# Patient Record
Sex: Male | Born: 1940 | Race: White | Hispanic: No | Marital: Married | Smoking: Former smoker
Health system: Southern US, Community
[De-identification: ages and names within clinical notes are randomized; demographics above are authoritative.]

## PROBLEM LIST (undated history)

## (undated) ENCOUNTER — Encounter

## (undated) DIAGNOSIS — I480 Paroxysmal atrial fibrillation: Secondary | ICD-10-CM

## (undated) DIAGNOSIS — I2510 Atherosclerotic heart disease of native coronary artery without angina pectoris: Secondary | ICD-10-CM

## (undated) DIAGNOSIS — D369 Benign neoplasm, unspecified site: Secondary | ICD-10-CM

## (undated) DIAGNOSIS — R55 Syncope and collapse: Secondary | ICD-10-CM

## (undated) DIAGNOSIS — C801 Malignant (primary) neoplasm, unspecified: Secondary | ICD-10-CM

## (undated) DIAGNOSIS — I499 Cardiac arrhythmia, unspecified: Secondary | ICD-10-CM

## (undated) DIAGNOSIS — E041 Nontoxic single thyroid nodule: Secondary | ICD-10-CM

## (undated) DIAGNOSIS — Z860101 Personal history of adenomatous and serrated colon polyps: Secondary | ICD-10-CM

## (undated) DIAGNOSIS — S066X9A Traumatic subarachnoid hemorrhage with loss of consciousness of unspecified duration, initial encounter: Secondary | ICD-10-CM

## (undated) DIAGNOSIS — Z7901 Long term (current) use of anticoagulants: Secondary | ICD-10-CM

## (undated) DIAGNOSIS — M21622 Bunionette of left foot: Secondary | ICD-10-CM

## (undated) DIAGNOSIS — I10 Essential (primary) hypertension: Secondary | ICD-10-CM

## (undated) DIAGNOSIS — E785 Hyperlipidemia, unspecified: Secondary | ICD-10-CM

## (undated) DIAGNOSIS — E0590 Thyrotoxicosis, unspecified without thyrotoxic crisis or storm: Secondary | ICD-10-CM

## (undated) DIAGNOSIS — K5790 Diverticulosis of intestine, part unspecified, without perforation or abscess without bleeding: Secondary | ICD-10-CM

## (undated) DIAGNOSIS — I429 Cardiomyopathy, unspecified: Secondary | ICD-10-CM

## (undated) HISTORY — PX: CORONARY ANGIOPLASTY: SHX604

## (undated) HISTORY — PX: JOINT REPLACEMENT: SHX530

## (undated) HISTORY — PX: ADENOIDECTOMY: SUR15

## (undated) HISTORY — PX: OTHER SURGICAL HISTORY: SHX169

## (undated) HISTORY — PX: TONSILLECTOMY: SUR1361

## (undated) HISTORY — PX: FOOT SURGERY: SHX648

---

## 1994-01-04 DIAGNOSIS — E0520 Thyrotoxicosis with toxic multinodular goiter without thyrotoxic crisis or storm: Secondary | ICD-10-CM

## 1994-01-04 HISTORY — DX: Thyrotoxicosis with toxic multinodular goiter without thyrotoxic crisis or storm: E05.20

## 1997-10-11 HISTORY — PX: COLONOSCOPY: SHX174

## 1998-06-05 HISTORY — PX: CORONARY ANGIOPLASTY WITH STENT PLACEMENT: SHX49

## 1999-06-05 HISTORY — PX: CORONARY ANGIOPLASTY WITH STENT PLACEMENT: SHX49

## 2002-12-12 HISTORY — PX: COLONOSCOPY: SHX174

## 2008-04-02 ENCOUNTER — Ambulatory Visit: Payer: Self-pay | Admitting: Gastroenterology

## 2008-04-02 HISTORY — PX: COLONOSCOPY: SHX174

## 2008-08-12 ENCOUNTER — Ambulatory Visit: Payer: Self-pay | Admitting: Internal Medicine

## 2008-08-14 ENCOUNTER — Ambulatory Visit: Payer: Self-pay | Admitting: Internal Medicine

## 2008-10-22 ENCOUNTER — Ambulatory Visit: Payer: Self-pay | Admitting: Internal Medicine

## 2009-01-04 HISTORY — PX: TOTAL HIP ARTHROPLASTY: SHX124

## 2013-07-20 ENCOUNTER — Ambulatory Visit: Payer: Self-pay | Admitting: Gastroenterology

## 2013-07-20 HISTORY — PX: COLONOSCOPY: SHX174

## 2013-07-24 LAB — PATHOLOGY REPORT

## 2014-10-29 ENCOUNTER — Other Ambulatory Visit: Payer: Self-pay | Admitting: Family Medicine

## 2014-10-29 DIAGNOSIS — M25551 Pain in right hip: Principal | ICD-10-CM

## 2014-10-29 DIAGNOSIS — G8929 Other chronic pain: Secondary | ICD-10-CM

## 2014-11-08 ENCOUNTER — Ambulatory Visit: Payer: Self-pay

## 2014-11-08 ENCOUNTER — Other Ambulatory Visit: Payer: Self-pay

## 2014-11-22 ENCOUNTER — Ambulatory Visit
Admission: RE | Admit: 2014-11-22 | Discharge: 2014-11-22 | Disposition: A | Discharge status: Home / Self Care | Payer: Medicare Other | Source: Ambulatory Visit | Attending: Family Medicine | Admitting: Family Medicine

## 2014-11-22 DIAGNOSIS — G8929 Other chronic pain: Secondary | ICD-10-CM

## 2014-11-22 DIAGNOSIS — K5730 Diverticulosis of large intestine without perforation or abscess without bleeding: Secondary | ICD-10-CM | POA: Insufficient documentation

## 2014-11-22 DIAGNOSIS — M1611 Unilateral primary osteoarthritis, right hip: Secondary | ICD-10-CM | POA: Insufficient documentation

## 2014-11-22 DIAGNOSIS — M71551 Other bursitis, not elsewhere classified, right hip: Secondary | ICD-10-CM | POA: Insufficient documentation

## 2014-11-22 DIAGNOSIS — M25551 Pain in right hip: Principal | ICD-10-CM

## 2014-11-22 DIAGNOSIS — M948X5 Other specified disorders of cartilage, thigh: Secondary | ICD-10-CM | POA: Diagnosis not present

## 2014-11-22 MED ORDER — IOHEXOL 180 MG/ML  SOLN: 12.0000 mL | Freq: Once | INTRAMUSCULAR | Status: DC | PRN

## 2015-01-05 HISTORY — PX: TOTAL HIP ARTHROPLASTY: SHX124

## 2015-09-23 ENCOUNTER — Other Ambulatory Visit: Payer: Self-pay | Admitting: Internal Medicine

## 2015-09-23 DIAGNOSIS — E0590 Thyrotoxicosis, unspecified without thyrotoxic crisis or storm: Secondary | ICD-10-CM

## 2015-09-26 ENCOUNTER — Other Ambulatory Visit: Payer: Self-pay | Admitting: Internal Medicine

## 2015-09-26 DIAGNOSIS — E0590 Thyrotoxicosis, unspecified without thyrotoxic crisis or storm: Secondary | ICD-10-CM

## 2015-10-14 ENCOUNTER — Ambulatory Visit
Admission: RE | Admit: 2015-10-14 | Discharge: 2015-10-14 | Disposition: A | Discharge status: Home / Self Care | Payer: Medicare Other | Source: Ambulatory Visit | Attending: Internal Medicine | Admitting: Internal Medicine

## 2015-10-14 ENCOUNTER — Encounter
Admission: RE | Admit: 2015-10-14 | Discharge: 2015-10-14 | Disposition: A | Discharge status: Home / Self Care | Payer: Medicare Other | Source: Ambulatory Visit | Attending: Internal Medicine | Admitting: Internal Medicine

## 2015-10-14 DIAGNOSIS — E042 Nontoxic multinodular goiter: Secondary | ICD-10-CM | POA: Insufficient documentation

## 2015-10-14 DIAGNOSIS — E0590 Thyrotoxicosis, unspecified without thyrotoxic crisis or storm: Secondary | ICD-10-CM

## 2015-10-14 MED ORDER — SODIUM IODIDE I-123 7.4 MBQ CAPS
149.0400 | ORAL_CAPSULE | Freq: Once | ORAL | Status: AC
  Administered 2015-10-14: 149.04 via ORAL

## 2015-10-15 ENCOUNTER — Encounter
Admission: RE | Admit: 2015-10-15 | Discharge: 2015-10-15 | Disposition: A | Discharge status: Home / Self Care | Payer: Medicare Other | Source: Ambulatory Visit | Attending: Internal Medicine | Admitting: Internal Medicine

## 2015-10-15 DIAGNOSIS — E042 Nontoxic multinodular goiter: Secondary | ICD-10-CM | POA: Diagnosis not present

## 2015-10-15 DIAGNOSIS — E0590 Thyrotoxicosis, unspecified without thyrotoxic crisis or storm: Secondary | ICD-10-CM | POA: Diagnosis present

## 2015-10-17 ENCOUNTER — Ambulatory Visit
Admission: RE | Admit: 2015-10-17 | Discharge: 2015-10-17 | Disposition: A | Discharge status: Home / Self Care | Payer: Medicare Other | Source: Ambulatory Visit | Attending: Internal Medicine | Admitting: Internal Medicine

## 2015-10-17 DIAGNOSIS — E0590 Thyrotoxicosis, unspecified without thyrotoxic crisis or storm: Secondary | ICD-10-CM | POA: Diagnosis not present

## 2015-10-17 MED ORDER — SODIUM IODIDE I 131 CAPSULE
40.1700 | Freq: Once | INTRAVENOUS | Status: AC | PRN
Start: 2015-10-17 — End: 2015-10-17
  Administered 2015-10-17: 40.17 via ORAL

## 2016-01-05 DIAGNOSIS — S066X9A Traumatic subarachnoid hemorrhage with loss of consciousness of unspecified duration, initial encounter: Secondary | ICD-10-CM

## 2016-01-05 DIAGNOSIS — S066XAA Traumatic subarachnoid hemorrhage with loss of consciousness status unknown, initial encounter: Secondary | ICD-10-CM

## 2016-01-05 HISTORY — DX: Traumatic subarachnoid hemorrhage with loss of consciousness status unknown, initial encounter: S06.6XAA

## 2016-01-05 HISTORY — DX: Traumatic subarachnoid hemorrhage with loss of consciousness of unspecified duration, initial encounter: S06.6X9A

## 2016-02-05 DIAGNOSIS — I639 Cerebral infarction, unspecified: Secondary | ICD-10-CM

## 2016-02-05 HISTORY — DX: Cerebral infarction, unspecified: I63.9

## 2016-02-18 DIAGNOSIS — F17200 Nicotine dependence, unspecified, uncomplicated: Secondary | ICD-10-CM

## 2016-02-18 DIAGNOSIS — S065X9A Traumatic subdural hemorrhage with loss of consciousness of unspecified duration, initial encounter: Secondary | ICD-10-CM

## 2016-02-18 DIAGNOSIS — Z7901 Long term (current) use of anticoagulants: Secondary | ICD-10-CM

## 2016-02-18 DIAGNOSIS — Z8679 Personal history of other diseases of the circulatory system: Secondary | ICD-10-CM

## 2016-02-18 DIAGNOSIS — E069 Thyroiditis, unspecified: Secondary | ICD-10-CM

## 2016-02-18 DIAGNOSIS — G936 Cerebral edema: Secondary | ICD-10-CM

## 2016-02-18 DIAGNOSIS — I4891 Unspecified atrial fibrillation: Secondary | ICD-10-CM

## 2016-02-18 DIAGNOSIS — E785 Hyperlipidemia, unspecified: Secondary | ICD-10-CM

## 2016-02-18 DIAGNOSIS — Z96643 Presence of artificial hip joint, bilateral: Secondary | ICD-10-CM

## 2016-02-18 DIAGNOSIS — I482 Chronic atrial fibrillation: Secondary | ICD-10-CM

## 2016-02-18 DIAGNOSIS — R55 Syncope and collapse: Secondary | ICD-10-CM

## 2016-02-18 DIAGNOSIS — S066X9A Traumatic subarachnoid hemorrhage with loss of consciousness of unspecified duration, initial encounter: Principal | ICD-10-CM

## 2016-02-18 DIAGNOSIS — I2510 Atherosclerotic heart disease of native coronary artery without angina pectoris: Secondary | ICD-10-CM

## 2016-02-18 DIAGNOSIS — I609 Nontraumatic subarachnoid hemorrhage, unspecified: Secondary | ICD-10-CM

## 2016-02-18 DIAGNOSIS — I10 Essential (primary) hypertension: Secondary | ICD-10-CM

## 2016-02-18 DIAGNOSIS — I629 Nontraumatic intracranial hemorrhage, unspecified: Secondary | ICD-10-CM

## 2016-02-19 ENCOUNTER — Inpatient Hospital Stay: Admit: 2016-02-19 | Discharge: 2016-02-23 | Disposition: A | Discharge status: Home / Self Care | Admitting: Surgery

## 2016-02-19 DIAGNOSIS — S066X9A Traumatic subarachnoid hemorrhage with loss of consciousness of unspecified duration, initial encounter: Principal | ICD-10-CM

## 2016-02-19 DIAGNOSIS — E0590 Thyrotoxicosis, unspecified without thyrotoxic crisis or storm: Secondary | ICD-10-CM

## 2016-02-19 DIAGNOSIS — S065X9A Traumatic subdural hemorrhage with loss of consciousness of unspecified duration, initial encounter: Secondary | ICD-10-CM

## 2016-02-19 DIAGNOSIS — I2510 Atherosclerotic heart disease of native coronary artery without angina pectoris: Secondary | ICD-10-CM

## 2016-02-19 DIAGNOSIS — I4891 Unspecified atrial fibrillation: Principal | ICD-10-CM

## 2016-02-19 DIAGNOSIS — R55 Syncope and collapse: Secondary | ICD-10-CM

## 2016-02-19 DIAGNOSIS — E785 Hyperlipidemia, unspecified: Secondary | ICD-10-CM

## 2016-02-19 DIAGNOSIS — I10 Essential (primary) hypertension: Secondary | ICD-10-CM

## 2016-02-19 DIAGNOSIS — Z8679 Personal history of other diseases of the circulatory system: Secondary | ICD-10-CM

## 2016-02-19 DIAGNOSIS — I482 Chronic atrial fibrillation: Secondary | ICD-10-CM

## 2016-02-19 DIAGNOSIS — Z7901 Long term (current) use of anticoagulants: Secondary | ICD-10-CM

## 2016-02-19 DIAGNOSIS — G936 Cerebral edema: Secondary | ICD-10-CM

## 2016-02-19 DIAGNOSIS — E069 Thyroiditis, unspecified: Secondary | ICD-10-CM

## 2016-02-19 DIAGNOSIS — F17200 Nicotine dependence, unspecified, uncomplicated: Secondary | ICD-10-CM

## 2016-02-19 DIAGNOSIS — W19XXXA Unspecified fall, initial encounter: Secondary | ICD-10-CM

## 2016-02-19 DIAGNOSIS — Z96643 Presence of artificial hip joint, bilateral: Secondary | ICD-10-CM

## 2016-02-19 MED ORDER — METOPROLOL TARTRATE 50 MG PO TABS
50 mg | Freq: Two times a day (BID) | ORAL | Status: DC
Start: 2016-02-19 — End: 2016-02-19

## 2016-02-19 MED ORDER — PRAVASTATIN SODIUM 10 MG PO TABS
10 mg | Freq: Every evening | ORAL | Status: DC
Start: 2016-02-19 — End: 2016-02-19

## 2016-02-19 MED ORDER — SIMVASTATIN 20 MG PO TABS
20 mg | ORAL_TABLET | Freq: Every evening | ORAL
Start: 2016-02-19 — End: ?

## 2016-02-19 MED ORDER — DEXTROSE 50 % IV SOLN
30 mL | INTRAVENOUS | Status: DC | PRN
Start: 2016-02-19 — End: 2016-02-24

## 2016-02-19 MED ORDER — APIXABAN 5 MG PO TABS
5 mg | Freq: Two times a day (BID) | ORAL | Status: SS
Start: 2016-02-19 — End: 2016-02-23

## 2016-02-19 MED ORDER — PERFLUTREN LIPID MICROSPHERE 6.52 MG/ML IV SUSP
.2-1.3 mL | INTRAVENOUS | Status: CP | PRN
Start: 2016-02-19 — End: ?

## 2016-02-19 MED ORDER — LEVETIRACETAM 500 MG PO TABS
500 mg | Freq: Two times a day (BID) | ORAL | Status: DC
Start: 2016-02-19 — End: 2016-02-24

## 2016-02-19 MED ORDER — HYDROMORPHONE HCL 2 MG/ML IJ SOLN JX
.5 mg | Freq: Once | INTRAVENOUS | Status: CP
Start: 2016-02-19 — End: ?

## 2016-02-19 MED ORDER — PRAVASTATIN SODIUM 10 MG PO TABS
10 mg | Freq: Every evening | ORAL | Status: DC
Start: 2016-02-19 — End: 2016-02-24

## 2016-02-19 MED ORDER — PNEUMOCOCCAL 13-VAL CONJ VACC IM SUSP
.5 mL | Freq: Once | INTRAMUSCULAR | Status: CP
Start: 2016-02-19 — End: ?

## 2016-02-19 MED ORDER — GLUCOSE 4 G PO CHEW JX
16 g | ORAL | Status: DC | PRN
Start: 2016-02-19 — End: 2016-02-19

## 2016-02-19 MED ORDER — LEVETIRACETAM 500 MG PO TABS
1000 mg | Freq: Once | ORAL | Status: CP
Start: 2016-02-19 — End: ?

## 2016-02-19 MED ORDER — OXYCODONE HCL 5 MG PO TABS
5 mg | ORAL | Status: DC | PRN
Start: 2016-02-19 — End: 2016-02-19

## 2016-02-19 MED ORDER — ACETAMINOPHEN 500 MG PO TABS
1000 mg | Freq: Four times a day (QID) | ORAL | Status: CP
Start: 2016-02-19 — End: ?

## 2016-02-19 MED ORDER — MUPIROCIN 2 % EX OINT
Freq: Two times a day (BID) | NASAL | Status: DC
Start: 2016-02-19 — End: 2016-02-24

## 2016-02-19 MED ORDER — METOPROLOL TARTRATE 50 MG PO TABS
50 mg | Freq: Two times a day (BID) | ORAL | Status: DC
Start: 2016-02-19 — End: 2016-02-24

## 2016-02-19 MED ORDER — ASPIRIN 81 MG PO TABS
81 mg | Freq: Every day | ORAL | Status: SS
Start: 2016-02-19 — End: 2016-02-23

## 2016-02-19 NOTE — ED Provider Notes
Basophils % 0.1  0 - 1 %   APTT - Normal    PTT 32  24 - 35 seconds   MAGNESIUM - Normal    Magnesium 1.9  1.8 - 2.6 mg/dL   PHOSPHORUS - Normal    Phosphorus,Inorganic 3.1  2.5 - 4.5 mg/dL   THROMBOELASTOGRAPH (TEG)   PLATELET MAPPING TEG (PMAP)   TYPE & SCREEN    ABO Grouping AB      Rh Type Positive      Antibody Screen Negative      Specimen Expiration 2016-02-21 23:59     CBC AND DIFFERENTIAL         Imaging (Read by ED Provider):  not applicable          ED Course & Re-Evaluation     ED Course     Patient brought as a transfer from OSH for fall on thinners.   Patient alert and oriented with non-focal exam. Well appearig  Noted to have undetectable TSH at OSH. Diffsuely tender thyroid here and TTP. Concern for thyroiditis without clinical s/s of thyroid storm. Offered medicine consult but surgery declined at this time.  Per history of patient his fall was mechnical. He did have a fairly extensive medical eval at OSH which was negative. No massive PE or other obvious cause of syncope.  Patient good from a medical standpoint at this time and suitable for admit to SICU with serial neuro exams and repeat head imaging.  Cannot reverse 2/2 eliquis.      MDM   Decide to obtain history from someone other than the patient: Yes - EMS    Decide to obtain previous medical records: No    Clinical Lab Test(s): N/A    Diagnostic Tests (Radiology, EKG): N/A    Independent Visualization (ED US, Wet Prep, Other): No    Discussed patient with NON-ED Provider: Admitting Team      ED Disposition   ED Disposition: Admit      ED Clinical Impression   ED Clinical Impression:   Atrial fibrillation  SAH (subarachnoid hemorrhage)  Thyroiditis  Intracranial hemorrhage      ED Patient Status   Patient Status:   Guarded        ED Medical Evaluation Initiated   Medical Evaluation Initiated:   Yes, filed at 02/19/16 0008  by Bonna GainsSlama, Richard, MD             Bonna GainsSlama, Richard, MD  Resident  02/19/16 [phone]

## 2016-02-19 NOTE — Progress Notes
Pt stated that his wife, Rondel Jumbollen Bommarito whose number is [phone] will be coming in the morning.  He requested a catholic priest, so called Father Dallas BreedingBlair Gaines and requested he call us back.  Left patient's name and room number.

## 2016-02-19 NOTE — Consults
Inpatient Speech Language Pathology  Cognitive Evaluation  Session: # 1      History of Present Illness: Alexander Padilla is a 76 y.o. male admitted on 02/18/2016 with PMH of CAD s/p stent placement x2 who fell due to syncopal episode (vasovagal vs hypovolemic vs cardiac). The height of the fall was estimated to be from standing. There is a history of amnesia. he did have LOC. The patient was reported to have a GCS of 15 in the field. He did not require intubation prior to arrival. EMS reports that the patient was hemodynamically stable in the field. Upon arrival the patient was complaining of thyroid pain.     Room/Bed: 8033/8033-02    Past Medical History:   Diagnosis Date   ? Atrial fibrillation    ? CAD (coronary artery disease)     s/p pci   ? Fall    ? HTN (hypertension)    ? Hyperthyroidism     s/p RAI     Past Surgical History:   Procedure Laterality Date   ? TOTAL HIP ARTHROPLASTY Bilateral        Current Condition/Chief Complaint: Alexander Padilla is a 76 y.o. male whose who was referred for cognitive evaluation.    Vitals: Vitals stable    Precautions:  ? FALL    Current Diet Order: Diet (cardiac fat,chol,sod,caff rstrctd)    Pain:  ? Pain Pre-Treatment: No, 0/10  ? Pain Post-Treatment:  No, 0/10    Living Environment/Function:  ? Lives: with spouse  Home assistance: 24/7  ? Prior Level of Function: was independent with communication to convey daily personal, medical, and safety wants/needs in the home and the community    Subjective:  Pt and RN agreed to intervention    **Was an interpreter used: NA    Tracheostomy: None    Response to Stimuli:   ? WNL    Visual Tracking:  ? WNL    Auditory Comprehension:  ? 1-step commands: WFL  ? 2-3 step commands: WFL  ? Constituant questions: WFL  ? YES/NO questions: WFL    Orientation:  ? Name  ? Place  ? Age  ? DOB  ? Month  ? Day  ? Year  ? Situation    Memory:  Short Term Memory:  ? Immediate recall: 5/5  ? Delayed recall: 2/5

## 2016-02-19 NOTE — Consults
?   Sensation: Intact  ? Proprioception: Intact   ? Coordination: Within Functional Limits  ? Motor Control: Within Functional Limits  ? Tone: WFL  ? Reflexes: Not Tested    Balance:   ? Sitting: Static  Supervision (S) and Dynamic  Supervision (S)  ? Standing: Static  Contact guard assist (CGA) and Dynamic  Minimum assistance (min A)    Functional Activities:  Bed Mobility:  ? Rolling Right: Supervision (S) with head of bed (HOB) elevated  ? Supine to Sit: Minimal Assist (Min A) with head of bed (HOB) elevated  Transfers:  ? Sit to/from Stand: Contact Guard Assist (CGA) with No assistive device  Locomotion:  ? GAIT: Patient ambulated to bathroom with No assistive device, gait belt, Minimal Assist (Min A). Gait Quality: ambulated with unsteady gait, wide BOS, and tended to reach out for furniture for support.  Continued gait training with RW, see treatment below.    Outcome Measures:  [employer] AM-PAC Basic Mobility Inpatient Short Form   (Without Stair Climbing):  HOW MUCH DIFFICULTY DOES THE PATIENT CURRENTLY HAVE? SCORE   1. Turning over in bed (including adjusting bedclothes, sheets and blankets)?  2. Moving from lying on back to sitting on the side of the bed?  3. Sitting down on and standing up from a chair with arms? 4  3  3    HOW MUCH HELP FROM ANOTHER PERSON DOES THE PATIENT CURRENTLY NEED?    4. Moving to and from a bed to a chair (including a wheelchair)?  5. Need help to walk in hospital room? 3  3   Scoring: 1 = total (dependent)/unable; 2 = a lot (max/mod assist); 3 = a little (min assist/CGA/SBA/SUP); 4 = none (independent or modified independent).    AM-PAC BASIC MOBILITY SCALE SHORT FORM (without stairs): Raw Score: 16. AM-PAC Score: 45.54. CMS Score: 40.64% - CK      Treatment on Evaluation:    ? TREATMENT TIME: 10 min  ? GAIT: Patient ambulated 100 ft x2 with Rolling Walker, gait belt, Contact Guard Assist (CGA). Gait Quality: Progressed from min A with no AD

## 2016-02-19 NOTE — Plan of Care
Problem: Discharge Planning  Goal: Safe effective discharge    Intervention: Assess discharge needs  Discharge plan is likely home with Wife to assist pending medical and Rehab clearance.

## 2016-02-19 NOTE — ED Notes
Pt log rolled maintaining C-spine immobilization.  No tenderness noted to C-, T-, or L-spine area.  No step offs noted.  Pt log rolled back to supine position.  Warm blankets applied.

## 2016-02-19 NOTE — Consults
Occupational Therapy Evaluation      Start Time (min): 1024  End Time (min): 1046  Total Time (min): 22  Room/Bed: 8033/8033-02    Occupational Profile: Alexander Padilla is a 76 y.o. male admitted on 02/18/2016 s/p fall from standing 2t syncopal episode with R frontal SAH      ICD-10-CM ICD-9-CM   1. SAH (subarachnoid hemorrhage) I60.9 430   2. Atrial fibrillation I48.91 427.31   3. Thyroiditis E06.9 245.9   4. Intracranial hemorrhage I62.9 432.9     Past Medical History:   Diagnosis Date   ? Atrial fibrillation    ? CAD (coronary artery disease)     s/p pci   ? Fall    ? HTN (hypertension)    ? Hyperthyroidism     s/p RAI     Past Surgical History:   Procedure Laterality Date   ? TOTAL HIP ARTHROPLASTY Bilateral        Precautions:  ? FALL    Extremity Precautions:  ? None indicated    Orthotic, Protective, & Supportive Devices:  ? None    Living Environment/Function:  ? Lives: with spouse  Home assistance: 24/7  ? Lives in: condo  ? Stairs to Enter: 0  ? Home DME: rolling walker and shower chair  ? Bathroom Setup: standard toilet, walk-in shower, shower chair  ? Employment: Patient is retired.  ? Prior Level of Function: was independent with self-care, transfers, ambulation, household tasks and/or recreational activities    Subjective: Pt/RN consents to therapy, c.o 4/10 headache. RN informed and pt repositioned at the end of the session      Examination:  Observation: Pt supine in bed, ICU monitors, 1L O2 via NC    Vitals: Vitals stable    Cognition:   ? Overall Cognitive Functional Status: Patient at baseline level of cognition  ? Arousal/Alertness: appropriate responses to stimuli  ? Orientation: oriented x4  ? Command Following: follows one-step simple commands  ? Safety Awareness/Judgement: good awareness of safety precautions  ? Problem Solving: able to solve independently  ? Sequencing: able to sequence independently  ? Attention Span: appears intact  ? Memory: appears intact

## 2016-02-19 NOTE — ED Provider Notes
?   Drug use: Not on file   ? Sexual activity: Not on file     Other Topics Concern   ? Not on file     Social History Narrative       Review of Systems   HENT: Negative for neck pain and neck stiffness.    Respiratory: Negative for chest tightness and shortness of breath.    Cardiovascular: Negative for chest pain.   Gastrointestinal: Negative for vomiting.   Skin: Negative for pallor.   All other systems reviewed and are negative.      Physical Exam     ED Triage Vitals   BP 02/18/16 2355 118/68   Pulse 02/18/16 2355 104   Resp 02/18/16 2355 26   Temp 02/18/16 2355 37.6 ?C (99.6 ?F)   Temp src 02/18/16 2355 Oral   Height --    Weight --    SpO2 02/18/16 2355 94 %   BMI (Calculated) --              Physical Exam   Constitutional: He is oriented to person, place, and time. He appears well-developed and well-nourished. No distress.   HENT:   Head: Normocephalic and atraumatic.   Right Ear: External ear normal.   Left Ear: External ear normal.   Eyes: EOM are normal. Pupils are equal, round, and reactive to light.   Neck: Normal range of motion. Neck supple. No JVD present. No tracheal deviation present. Thyromegaly (bilateral. TTP) present.   Cardiovascular: Normal rate and regular rhythm.  Exam reveals no friction rub.    No murmur heard.  Pulmonary/Chest: Effort normal and breath sounds normal. No respiratory distress.   Abdominal: Soft. He exhibits no distension. There is no tenderness.   Musculoskeletal: Normal range of motion. He exhibits no edema, tenderness or deformity.   Neurological: He is alert and oriented to person, place, and time.   Skin: Skin is warm and dry. No rash noted.   Psychiatric: He has a normal mood and affect.       Differential DDx: Considered but not limited to the following ICH, C spine injury, laceration, fracture, mechanical fall or syncope-related fall, abdominal viscera injury, PTX, hemothorax, long bone fracture, cardiac injury, arterial injury, venous injury

## 2016-02-19 NOTE — ED Provider Notes
History     Chief Complaint   Patient presents with   ? FALL FROM STANDING       HPI Comments: 76 y/o male transfer from OSH s/p mechanical fall on coumadin with IC bleed and SAH. Patient currently complaning of head and anterior soft tissue neck pain.    Patient is a 76 y.o. male presenting with Trauma.   Trauma  Mechanism of injury: fall  Injury location: head/neck  Injury location detail: head  Arrived directly from scene: no     Fall:       Fall occurred: tripped       Impact surface: unknown       Point of impact: head       Entrapped after fall: no       Suspicion of alcohol use: no       Suspicion of drug use: no    EMS/PTA data:       Blood loss: none       Responsiveness: alert       Oriented to: person, place, situation and time       Airway interventions: none       Breathing interventions: none       IV access: none       IO access: none       Medications administered: none       Immobilization: none       Airway condition since incident: stable       Breathing condition since incident: stable       Circulation condition since incident: stable       Mental status condition since incident: stable       Disability condition since incident: stable    Current symptoms:       Pain scale: 5/10       Pain quality: aching       Pain timing: constant       Associated symptoms:             Denies chest pain, neck pain and vomiting.     Relevant PMH:       Pharmacological risk factors:             Anticoagulation therapy.        Tetanus status: UTD      No Known Allergies    Patient's Medications    No medications on file       No past medical history on file.    No past surgical history on file.    No family history on file.    Social History     Social History   ? Marital status: Married     Spouse name: N/A   ? Number of children: N/A   ? Years of education: N/A     Social History Main Topics   ? Smoking status: Not on file   ? Smokeless tobacco: Not on file   ? Alcohol use Not on file

## 2016-02-19 NOTE — ED Provider Notes
Differential DDx: Considered but not limited to the following ICH, C spine injury, laceration, fracture, mechanical fall or syncope-related fall, abdominal viscera injury, PTX, hemothorax, long bone fracture, cardiac injury, arterial injury, venous injury    Is this an Emergent Medical Condition? Yes - Threat to Patient or Fetus  409.901 FS  641.19 FS  627.732 (16) FS    ED Workup   Procedures    Labs:  -   PROTIME-INR - Abnormal        Result Value Ref Range    Protime 15.9 (*) 11.9 - 14.3 seconds    INR 1.3 (*) 0.9 - 1.1   BASIC METABOLIC PANEL - Abnormal     Sodium 137  135 - 145 mmol/L    Potassium 4.2  3.3 - 4.6 mmol/L    Chloride 100 (*) 101 - 110 mmol/L    CO2 24  21 - 29 mmol/L    Urea Nitrogen 18  6 - 22 mg/dL    Creatinine 0.82  0.67 - 1.17 mg/dL    BUN/Creatinine Ratio 22.0  6.0 - 22.0 (calc)    Glucose 135 (*) 71 - 99 mg/dL    Calcium 8.4 (*) 8.6 - 10.0 mg/dL    Osmolality Calc 277.7      Anion Gap 13  4 - 16 mmol/L    EGFR >59  mL/min/1.73M2    Comment:   Reference range: =>90 ml/min/1.73M2  eGFR estimates are unable to accurately differentiate levels of GFR above 60 ml/min/1.73M2.   CBC AUTODIFF - Abnormal     WBC 11.66 (*) 4.5 - 11 x10E3/uL    RBC 4.18 (*) 4.50 - 6.30 x10E6/uL    Hemoglobin 12.4 (*) 14.0 - 18.0 g/dL    Hematocrit 36.6 (*) 40.0 - 54.0 %    MCV 87.6  82.0 - 101.0 fl    MCH 29.7  27.0 - 34.0 pg    MCHC 33.9  31.0 - 36.0 g/dL    RDW 12.6  12.0 - 16.1 %    Platelet Count 143  140 - 440 thou/cu mm    MPV 10.3  9.5 - 11.5 fl    nRBC % 0.0  0.0 - 1.0 %    Absolute NRBC Count 0.00      Neutrophils % 89.7 (*) 34.0 - 73.0 %    Lymphocytes % 4.6 (*) 25.0 - 45.0 %    Monocytes % 5.3  2.0 - 6.0 %    Eosinophils % 0.0 (*) 1.0 - 4.0 %    Immature Granulocytes % 0.3  0.0 - 2.0 %    Neutrophils Absolute 10.46 (*) 1.80 - 8.70 x10E3/uL    Lymphocytes Absolute 0.54  x10E3/uL    Monocytes Absolute 0.62  x10E3/uL    Eosinophils Absolute 0.00  x10E3/uL    Basophil Absolute 0.01  x10E3/uL

## 2016-02-19 NOTE — Progress Notes
Pt working with PT/OT. Pt voided with therapist but in the toilet so unable to account for amount.

## 2016-02-19 NOTE — Plan of Care
Problem: Pain, Acute & Chronic  Goal: Pain is relieved/acceptable level with minimal side effects  Outcome: Ongoing  See note at bottom,    Problem: Impaired Skin Integrity, Risk for  Goal: Maintains integrity of mucous membranes, skin and tissues  Outcome: Ongoing  See bottom note    Problem: Risk for Injury R/T Falls  Goal: Prevent Falls  Outcome: Ongoing      Comments: Pt. Arrived to unit at 0300, no family at bedside, Pt is A&O X 4 , small laceration on left side of cheek, Bed in lowest position, side rails up 3/4, room free of clutter.  This Nurse assessed and orientated pt to unit, policies, and procedures. Educated patient on Safety regimens, and  scheduled and prn meds, purposes, and side effects. Pt verbalized understanding. This Nurse encouraged pt to expressed question and concerns, pt denies any additional concerns at this time. Will continue to monitor and follow POC

## 2016-02-19 NOTE — Progress Notes
02/19/16 0457   Vitals   Pulse 105   Heart Rate Source Monitor   Heart Rate 94   Resp 18   BP (!) 126/94   BP Method Automatic   Patient Position Supine   BP Location Left upper arm   NIBP MAP  99 mmHg      Pt transported to CT and back via bed. VSS. NAD noted.

## 2016-02-19 NOTE — Progress Notes
Per screening assessment data:  Meets pneumococcal requirements (age > 6765) -> ordered x 1 for 02/19/2016 at 0900  Hazle Cocaonald Floresca, RN, Pharm.D., BCPS

## 2016-02-19 NOTE — ED Notes
Jacqlyn KraussSylvester, MD with SICU team at bedside with pt.

## 2016-02-19 NOTE — ED Provider Notes
History     Chief Complaint   Patient presents with   ? FALL FROM STANDING       HPI Comments: 76 y/o male transfer from OSH s/p mechanical fall on coumadin with IC bleed and SAH. Patient currently complaning of head and anterior soft tissue neck pain.    Patient is a 76 y.o. male presenting with Trauma.   Trauma  Mechanism of injury: fall  Injury location: head/neck  Injury location detail: head  Arrived directly from scene: no     Fall:       Fall occurred: tripped       Impact surface: unknown       Point of impact: head       Entrapped after fall: no       Suspicion of alcohol use: no       Suspicion of drug use: no    EMS/PTA data:       Blood loss: none       Responsiveness: alert       Oriented to: person, place, situation and time       Airway interventions: none       Breathing interventions: none       IV access: none       IO access: none       Medications administered: none       Immobilization: none       Airway condition since incident: stable       Breathing condition since incident: stable       Circulation condition since incident: stable       Mental status condition since incident: stable       Disability condition since incident: stable    Current symptoms:       Pain scale: 5/10       Pain quality: aching       Pain timing: constant       Associated symptoms:             Denies chest pain, neck pain and vomiting.     Relevant PMH:       Pharmacological risk factors:             Anticoagulation therapy.        Tetanus status: UTD      No Known Allergies    Patient's Medications    No medications on file       Past Medical History:   Diagnosis Date   ? Atrial fibrillation    ? CAD (coronary artery disease)     s/p pci   ? HTN (hypertension)    ? Hyperthyroidism     s/p RAI       Past Surgical History:   Procedure Laterality Date   ? TOTAL HIP ARTHROPLASTY Bilateral        Family History   Problem Relation Age of Onset   ? No Known Problems Mother    ? No Known Problems Father

## 2016-02-19 NOTE — ED Notes
Pt taken to room with belongings and chart on monitors.

## 2016-02-19 NOTE — Consults
to CGA with use of RW, had 1 episode of Bil knee buckling but able to recover with CGA.       Post Treatment:   Patient Position/Safety: Sitting in recliner, Call Bell within reach, Tray table within reach, All lines and leads intact, Handoff to nurse on patient position    Assessment: Patient requires little assistance for mobility; however, is unsteady upon standing and had multiple episodes of LOB during ambulation.  Pt is at increased risk for falls and not safe to d/c home at this time.  Anticipate with further progress pt may be able to d/c home with supervision.    Problem list:  ? impaired bed mobility  ? impaired transfers  ? impaired gait  ? balance deficits  ? increased risk for falls  ? decreased activity tolerance    Other Recommended Services: none    Discharge Recommendations:   ? Discharge Disposition: Inpatient Rehab at this time due to high fall risk and need for assistance with mobility, will reassess d/c needs next visit.      ? DME Recommendations:  1. Already has necessary DMEs    **Note: Discharge recommendations may change based on patient progress. Please refer to the most updated progress note for current discharge recommendations.     Plan of Care: Patient will be seen 3-5 times per week for gait training, therapeutic activities, therapeutic exercise, balance, activity tolerance, patient/caregiver education and training and safety.    G-Codes:  G-Code: Mobility    Current Status: CK 40-59% Impaired     Goal Status: CI 1-19% Impaired     Discharge Status: N/A    _________________________   Katharina CaperNura Haney, PT   02/19/2016

## 2016-02-19 NOTE — ED Notes
Pt resting in bed awaiting further orders.  NAD noted.  No change in pt assessment at this time.

## 2016-02-19 NOTE — Consults
Long Term Memory: Appears intact (+) recall of current President    Attention:  ? Sustained    Pragmatics:  ? Flat affect    Behavior:  ? Alert  ? Appropriate    Executive Functions:  ? Sequencing: simple WFL; complex WFL  ? Reasoning: WFL  ? Insight: WFL - appropriate insight to curren situation and deficits   ? Abstraction: WFL  ? Serial 7s: [facility]    Auditory Processing:  ? Follows 1 step commands WFL  ? Answers wh-questions WFL  ? Y/N questions WFL    Problem Solving:  ? Simple problems: WFL  ? Complex problems: WFL  ? Safety: Appropriate safety awareness - will defer environmental safety to PT/OT    Safety:  ? Intermittent supervision    Outcome Measures:   MOCA-blind: 19/22 - WFL >=18/22    Assessment:   Pt presents with functional cognition - suspect at baseline at this time.  Observed trace memory deficits, compensatory strategies on board.  Recommend intermittent supervision upon d/c planning for completion of high level tasks.  No further therapeutic intervention indicated at this time - SLP to d/c orders - Please reconsult if status changes - Thanks for the referrals.      Discharge Disposition: Home with intermittent caregiver supervision    G-Code: Attention: Current Status: CH 0% Impaired     Goal Status: CH 0% Impaired     Discharge Status: CH 0% Impaired    Recommendations:  1. Functional cognition - suspect at baseline  2. Intermittent supervision for completion of high level tasks  3. SLP to d/c orders - Thanks for the referrals    Plan of Care: Discharged from SLP Services    _________________________  Kylie B. Lowery, MS, CCC-SLP  02/19/2016       Start Time (min): 0906  End Time (min): 0916  Total Time (min): 10

## 2016-02-19 NOTE — ED Notes
[facility]  Pt was found at home on floor by wife. Pt is stated to have questionable syncopal episode. Pt is currently on aspirin and Eliquis.  Pt had positive LOC.  No wounds noted at this time.  Pt sitting up on stretcher upon arrival.  Pt A/O x 4 PERRLA 3.  Trace swelling noted to anterior neck area.  Breath sounds clear BIL with clear heart tones.  Abd soft non-tender with bowel sounds noted.  Pelvis stable to palpate.  Pt able to move all extremities with good distal peripheral pulses.

## 2016-02-19 NOTE — ED Provider Notes
Is this an Emergent Medical Condition? {SH ED EMERGENT MEDICAL CONDITION:[id number]}  409.901 FS  641.19 FS  627.732 (16) FS    ED Workup   Procedures    Labs:  - - No data to display      Imaging (Read by ED Provider):  {Imaging [phone]}      EKG (Read by ED Provider):  {EKG [phone]}        ED Course & Re-Evaluation     ED Course     Patient brought as a transfer from OSH for fall on thinners.   Patient alert and oriented with non-focal exam. Well appearig  Noted to have undetectable TSH at OSH. Diffsuely tender thyroid here and TTP. Concern for thyroiditis without clinical s/s of thyroid storm.  Per history of patient his fall was mechnical. He did have a fairly extensive medical eval at OSH which was negative. No massive PE or other obvious cause of syncope.  Patients noted to hae    MDM   Decide to obtain history from someone other than the patient: {SH ED Lamonte SakaiJX MDM - OBTAIN ZOXWRUE:45409}HISTORY:28378}    Decide to obtain previous medical records: [facility]{SH ED Lamonte SakaiJX MDM - PREVIOUS MED REC - NO WJX:91478}YES:28380}    Clinical Lab Test(s): {SH ED Lamonte SakaiJX MDM ORDERED AND REVIEWED:28124}    Diagnostic Tests (Radiology, EKG): {SH ED Lamonte SakaiJX MDM ORDERED AND REVIEWED:28124}    Independent Visualization (ED US, Wet Prep, Other): {SH ED Lamonte SakaiJX MDM NO YES GNFAOZHY:86578}WILDCARD:26444}    Discussed patient with NON-ED Provider: {SH ED Lamonte SakaiJX MDM - ANOTHER PROVIDER:28381}      ED Disposition   ED Disposition: Admit      ED Clinical Impression   ED Clinical Impression:   Atrial fibrillation  SAH (subarachnoid hemorrhage)  Thyroiditis  Intracranial hemorrhage      ED Patient Status   Patient Status:   {[facility]:[id number]}        ED Medical Evaluation Initiated   Medical Evaluation Initiated:   Yes, filed at 02/19/16 0008  by Bonna GainsSlama, Richard, MD

## 2016-02-19 NOTE — Plan of Care
Problem: Risk for Injury R/T Falls  Goal: Prevent Falls  Outcome: Ongoing  Educated pt on unit safety procedures and expectations, including the importance of utilizing the call light. Pt verbalized understanding and has light within reach. Will continue to closely monitor.

## 2016-02-19 NOTE — Plan of Care
Problem: Impaired bed mobility (PT)  Goal: PT STG : Patient will transition supine to/from sit in bed from left/right:   Patient will transition supine to/from sit in bed from bilateral with Independence (I) and no cues to prepare for OOB mobility.   Outcome: Ongoing      Problem: Impaired gait (PT)  Goal: PT STG : Patient will ambulate over even surfaces:   Patient will ambulate 200 feet over even surfaces with Rolling walker (RW) as needed, Modified Independence (Mod I) and no cues to mobilize in the home and community.    Outcome: Ongoing      Problem: Impaired balance (PT)  Goal: PT LTG : Patient will maintain static sitting balance in midline by discharge :  Patient will increase his AM Pac Mobility Scale score from 16/20 to 19/20 to return home safely upon d/c.    Outcome: Ongoing    Goal: PT STG : Patient will maintain dynamic standing balance within/outside base of support :   STG - Patient will maintain dynamic standing balance outside base of support during functional tasks with Supervision (S), No assistive device and no cues to reduce risk for falls during mobility.    Outcome: Ongoing      Comments: Patient will be seen by Physical Therapy 3-5x per week for 2 week(s), for impaired bed mobility, impaired transfers, impaired gait, balance deficits and increased risk for falls.    _________________________  Katharina CaperNura Haney, PT  02/19/2016

## 2016-02-19 NOTE — ED Provider Notes
Is this an Emergent Medical Condition? {SH ED EMERGENT MEDICAL CONDITION:([phone]}  409.901 FS  641.19 FS  627.732 (16) FS    ED Workup   Procedures    Labs:  - - No data to display      Imaging (Read by ED Provider):  {Imaging [phone]}      EKG (Read by ED Provider):  {EKG [phone]}        ED Course & Re-Evaluation     ED Course         MDM   Decide to obtain history from someone other than the patient: {SH ED Lamonte SakaiJX MDM - OBTAIN ZOXWRUE:45409}HISTORY:28378}    Decide to obtain previous medical records: [facility]{SH ED Lamonte SakaiJX MDM - PREVIOUS MED REC - NO WJX:91478}YES:28380}    Clinical Lab Test(s): {SH ED Lamonte SakaiJX MDM ORDERED AND REVIEWED:28124}    Diagnostic Tests (Radiology, EKG): {SH ED Lamonte SakaiJX MDM ORDERED AND REVIEWED:28124}    Independent Visualization (ED US, Wet Prep, Other): {SH ED Lamonte SakaiJX MDM NO YES GNFAOZHY:86578}WILDCARD:26444}    Discussed patient with NON-ED Provider: {SH ED Lamonte SakaiJX MDM - ANOTHER PROVIDER:28381}      ED Disposition   ED Disposition: Admit      ED Clinical Impression   ED Clinical Impression:   Atrial fibrillation  SAH (subarachnoid hemorrhage)  Thyroiditis  Intracranial hemorrhage      ED Patient Status   Patient Status:   {SH ED Lamonte SakaiJX PATIENT STATUS:[phone]}        ED Medical Evaluation Initiated   Medical Evaluation Initiated:   Yes, filed at 02/19/16 0008  by Bonna GainsSlama, Richard, MD

## 2016-02-19 NOTE — ED Notes
Report called to Asher MuirJamie, Charity fundraiserN. Pt stable at report.  Pt to go to room 8033.

## 2016-02-19 NOTE — Consults
?   Insight: decreased awareness of deficits    Right Upper Extremity:  ? Within Functional Limits [facility](WFL) for AROM/PROM and strength    Left Upper Extremity:  ? Within Functional Limits [facility](WFL) for AROM/PROM and strength    Vision History:   ? No significant visual history  Vision:  ? Vision WNL for visual fields, saccades, EOMs and acuity    Neurological Examination of the Upper Extremity:  ? Sensation: Intact  ? Proprioception: Intact   ? Coordination: Within Functional Limits  ? Motor Control: Within Functional Limits  ? Tone: WFL    ADLs:  ? Feeding: Independence (I)   ? Grooming: Contact guard assist (CGA) in standing at sinkside  ? UB dressing: Set up assist  ? LB dressing: Contact guard assist (CGA)  ? Bathing: Contact guard assist (CGA)  ? Toileting: Contact guard assist (CGA)    Functional Activities:  Bed Mobility:  ? Supine to Sit: Minimal Assist (Min A) with head of bed (HOB) elevated  Transfers:  ? Sit to/from Stand: Contact Guard Assist (CGA) with No assistive device    Functional mobility: Min A w/o AD x within his room, CGA w/ RW for functional mobility in the hallway/around the nurses station    Balance:   ? Sitting: Static  Supervision (S), Dynamic  Supervision (S)  ? Standing: Research officer, trade uniontatic  Contact Guard Assist (CGA), Dynamic  Minimal Assist (Min A)    Outcome Measures:  ? AM-PAC "6-clicks" Short Form: Raw Score: 18. AM-PAC Score: 38.66. CMS Score: 40.47% - CK    Special Tests:  ? NA    Treatment on Evaluation:  Was additional treatment provided? Yes ADLs: Pt donned back hospital gown from EOB level with Setup. Pt completed grooming tasks in standing at sinkside with CGA for balance. Pt completed toileting using hospital toilet with CGA for standing balance.    ADL Transfers: Pt completed STS from EOB with CGA and functional mobility within his room w/o AD with min A due to mild balance deficits and 2 episodes of B knee buckling.  Total Coded Minutes: 15    Post Treatment:

## 2016-02-19 NOTE — Consults
Physical Therapy Evaluation      Start Time (min): 1025  End Time (min): 1046  Total Time (min): 21  Room/Bed: 8033/8033-02    History of Present Illness: Alexander Padilla is a 76 y.o. male admitted on 02/18/2016 s/p fall, found to have SAH and SDH.       ICD-10-CM ICD-9-CM   1. SAH (subarachnoid hemorrhage) I60.9 430   2. Atrial fibrillation I48.91 427.31   3. Thyroiditis E06.9 245.9   4. Intracranial hemorrhage I62.9 432.9     Past Medical History:   Diagnosis Date   ? Atrial fibrillation    ? CAD (coronary artery disease)     s/p pci   ? Fall    ? HTN (hypertension)    ? Hyperthyroidism     s/p RAI     Past Surgical History:   Procedure Laterality Date   ? TOTAL HIP ARTHROPLASTY Bilateral        Precautions:  ? FALL    Extremity Precautions:  ? None indicated    Orthotic, Protective, & Supportive Devices:  ? None    Living Environment/Function:  ? Lives: with spouse  Home assistance: 24/7  ? Lives in: condo  ? Stair/steps to enter: 0  ? Home DME: does not use equipment but owns RW and bedside commode  ? Prior Level of Function: was independent with self-care, transfers, ambulation, household tasks and/or recreational activities    Subjective: Pt agreed to PT, c/o pain 4/10 in head, no increase in pain at end of treatment, pt positioned in recliner for comfort at end of treatment.    **Was an interpreter used: NA    Examination:  Observation: Pt presented supine in bed, on 1L O2 via NC, VSS, alert    Vitals: Vitals stable    Cognition:   ? Alert and Oriented to Person, Place, Time and Situation  ? Command Following: Follows Multi-step commands    Right Upper Extremity:  ? Within Functional Limits [facility](WFL) for AROM/PROM and strength    Left Upper Extremity:  ? Within Functional Limits [facility](WFL) for AROM/PROM and strength    Right Lower Extremity:  ? Within Functional Limits [facility](WFL) for AROM/PROM and strength    Left Lower Extremity:  ? Within Functional Limits [facility](WFL) for AROM/PROM and strength    Neurological Examination:

## 2016-02-19 NOTE — Consults
Patient Position/safety: Sitting in recliner, Call Bell within reach, Tray table within reach, All lines and leads intact, Handoff to nurse on patient position    Assessment: Patient with mild balance deficits, 2 episodes of B knee buckling during functional mobility which is making pt a high risk for falls. Pt also requires Min-SUP with OOB ADLs due to balance deficits and weakness. Pt not safe for dc home at this time. Recommend IPR when medically cleared by MD    Patient with deficits in:  ? ADLs, IADLs, judgment, balance, bed mobility, transfers, functional mobility and activity tolerance    Discharge Recommendations:   ? Discharge Disposition: Inpatient Rehab    ? DME Recommendations:  1. To be determined    **Note: Discharge recommendations may change based on patient progress. Please refer to the most updated progress note for current discharge recommendations.     Plan of Care: Patient will be seen 3-5 times per week for ADLs, IADLs , therapeutic activities , therapeutic exercise , patient/caregiver education and neuromuscular reeducation.      ________________________   Malachi ParadiseSheena Kristine Estalilla, OT   02/19/2016

## 2016-02-19 NOTE — ED Provider Notes
Social History     Social History   ? Marital status: Married     Spouse name: N/A   ? Number of children: N/A   ? Years of education: N/A     Social History Main Topics   ? Smoking status: Current Some Day Smoker   ? Smokeless tobacco: Never Used   ? Alcohol use Yes   ? Drug use: No   ? Sexual activity: Not Asked     Other Topics Concern   ? None     Social History Narrative   ? None       Review of Systems   HENT: Negative for neck pain and neck stiffness.    Respiratory: Negative for chest tightness and shortness of breath.    Cardiovascular: Negative for chest pain.   Gastrointestinal: Negative for vomiting.   Skin: Negative for pallor.   All other systems reviewed and are negative.      Physical Exam       ED Triage Vitals   BP 02/18/16 2355 118/68   Pulse 02/18/16 2355 104   Resp 02/18/16 2355 26   Temp 02/18/16 2355 37.6 ?C (99.6 ?F)   Temp src 02/18/16 2355 Oral   Height --    Weight --    SpO2 02/18/16 2355 94 %   BMI (Calculated) --              Physical Exam   Constitutional: He is oriented to person, place, and time. He appears well-developed and well-nourished. No distress.   HENT:   Head: Normocephalic and atraumatic.   Right Ear: External ear normal.   Left Ear: External ear normal.   Eyes: EOM are normal. Pupils are equal, round, and reactive to light.   Neck: Normal range of motion. Neck supple. No JVD present. No tracheal deviation present. Thyromegaly (bilateral. TTP) present.   Cardiovascular: Normal rate and regular rhythm.  Exam reveals no friction rub.    No murmur heard.  Pulmonary/Chest: Effort normal and breath sounds normal. No respiratory distress.   Abdominal: Soft. He exhibits no distension. There is no tenderness.   Musculoskeletal: Normal range of motion. He exhibits no edema, tenderness or deformity.   Neurological: He is alert and oriented to person, place, and time.   Skin: Skin is warm and dry. No rash noted.   Psychiatric: He has a normal mood and affect.

## 2016-02-19 NOTE — Progress Notes
Pastoral care screening. Seen pt seating in couch watching TV. Chaplain initiated a relationship of care and support. Pt stated he's doing fine, request [facility] [place] visit for confession and blessed sacrament. Advice pt, [facility] [place] are volunteers and they will come at their own convenient time. Pt express understanding. I called [facility]or[facility] P[facility]riest spoke with PATRECIA (clerk) provided to her pt request, Elease Hashimotoatricia stated she will pass it on to available [facility]. Pastoral care, prayer provided as request. Assisted by [employer] intern Levon.

## 2016-02-19 NOTE — ED Notes
Pt resting in bed awaiting further orders. NAD noted.  No further change in pt assessment at this time.

## 2016-02-20 DIAGNOSIS — S066X9A Traumatic subarachnoid hemorrhage with loss of consciousness of unspecified duration, initial encounter: Principal | ICD-10-CM

## 2016-02-20 DIAGNOSIS — I482 Chronic atrial fibrillation: Secondary | ICD-10-CM

## 2016-02-20 DIAGNOSIS — F17200 Nicotine dependence, unspecified, uncomplicated: Secondary | ICD-10-CM

## 2016-02-20 DIAGNOSIS — G936 Cerebral edema: Secondary | ICD-10-CM

## 2016-02-20 DIAGNOSIS — Z7901 Long term (current) use of anticoagulants: Secondary | ICD-10-CM

## 2016-02-20 DIAGNOSIS — S065X9A Traumatic subdural hemorrhage with loss of consciousness of unspecified duration, initial encounter: Secondary | ICD-10-CM

## 2016-02-20 DIAGNOSIS — E069 Thyroiditis, unspecified: Secondary | ICD-10-CM

## 2016-02-20 DIAGNOSIS — E785 Hyperlipidemia, unspecified: Secondary | ICD-10-CM

## 2016-02-20 DIAGNOSIS — Z8679 Personal history of other diseases of the circulatory system: Secondary | ICD-10-CM

## 2016-02-20 DIAGNOSIS — I2510 Atherosclerotic heart disease of native coronary artery without angina pectoris: Secondary | ICD-10-CM

## 2016-02-20 DIAGNOSIS — I10 Essential (primary) hypertension: Secondary | ICD-10-CM

## 2016-02-20 DIAGNOSIS — Z96643 Presence of artificial hip joint, bilateral: Secondary | ICD-10-CM

## 2016-02-20 DIAGNOSIS — R55 Syncope and collapse: Secondary | ICD-10-CM

## 2016-02-20 MED ORDER — TRAMADOL HCL 50 MG PO TABS
50 mg | Freq: Three times a day (TID) | ORAL | Status: DC | PRN
Start: 2016-02-20 — End: 2016-02-24

## 2016-02-20 MED ORDER — OXYCODONE HCL 5 MG PO TABS
5 mg | Freq: Four times a day (QID) | ORAL | Status: DC | PRN
Start: 2016-02-20 — End: 2016-02-20

## 2016-02-20 NOTE — Progress Notes
02/20/16 1224   Vitals   Pulse 89   Heart Rate Source Monitor   Heart Rate 85   Resp 17   BP 132/79   BP Method Automatic   NIBP MAP  106 mmHg   Oxygen Therapy   SpO2 96 %   Patient vitals signs standing.

## 2016-02-20 NOTE — Progress Notes
not had follow up labs  Check TSH, Free T3, Free T4- follow up with outpatient endocrine as scheduled    Extremities:  No issues, intact    Disp: d/c home with supervision    Social: Full Code  Wife is Alesia BandaOK, Ellen, [phone]    Quality Checklist:  HOB up 30 degrees: Yes  Chlorhexidine mouth care: No  Adequate glucose control:Yes  Central Line: No  Can Foley be removed:no   GI prophylaxis: po diet  DVT/VTE prophylaxis : sequential compression device(s), mild increase in hemorrhages, will hold chemical DVT ppx for now  Pressure ulcer: No  Mobilize out of bed: Yes  Vent wean assessment completed: N/A    Iona HansenShannon F Terrell, ARNP  02/20/2016  8:13 AM        ATTENDING NOTE    I have seen and examined the patient with Iona HansenShannon F Terrell, ARNP. I have reviewed the note and agree with the assessment and plan.     On my exam, I find the following: sitting up in bed on phone and eating breakfast.  Remains fully alert and GCS 15.  CT head stable.  ASA & A-C on hold for 4 weeks.    Cathlean MarseillesJoseph R Shiber, MD  02/20/2016 1:03 PM

## 2016-02-20 NOTE — Progress Notes
Department of Surgery  Division of Acute Care Surgery  SURGICAL CRITICAL CARE ICU PROGRESS NOTE      Admit Date: 02/18/2016   LOS: 1 day     Post op Day: na  Admitting service: Alexander Padilla          Subjective:     I have reviewed the patient course and previous notes.  Reason for ICU Admission/ Perioperative Course: Syncope,TBI on Eliquis and ASA, SAH, extra-axial hemorrhage    Events of past 24: In the past 24 hours the patient has had no problems    INJURIES/PROBLEMS  Active Hospital Problems    Diagnosis Date Noted   ? *Subarachnoid hematoma 02/19/2016   ? Syncope 02/19/2016   ? On anticoagulant therapy 02/19/2016   ? Chronic atrial fibrillation 02/19/2016      Resolved Hospital Problems    Diagnosis Date Noted Date Resolved   No resolved problems to display.     Current Hospital Medications  Reviewed  Scheduled Medications  ? acetaminophen  1,000 mg Oral Q6H SCH   ? levETIRAcetam  500 mg Oral Q12H SCH   ? metoprolol tartrate  50 mg Oral BID   ? mupirocin   Nasal BID   ? pneumococcal conjugate vaccine, PCV13  0.5 mL Intramuscular Once   ? pravastatin  10 mg Oral Nightly     PRN Medications    dextrose 30 mL Intravenous PRN   oxyCODONE 5 mg Oral Q6H PRN   perflutren lipid microspheres 0.2-1.3 mL Intravenous PRN     Continuous infusions    Objective Data and Assessment:       Vital Signs: Last Filed Vitals Signs: 24 Hour Range   BP: 114/73 (02/16 0700)  Temp: 37 ?C (98.6 ?F) (02/15 2000)  Resp: 21 (02/16 0700)  SpO2: 94 % (02/16 0700)   Temp:  [36.9 ?C (98.5 ?F)-37.1 ?C (98.7 ?F)] 37 ?C (98.6 ?F)  Pulse:  [82-119] 105  Resp:  [12-22] 21  BP: (99-148)/(62-93) 114/73      CNS/Neuro:   Exam:  oriented to time, place and person    Pain Scale: 4/10 / 10    GCS:   4 - Opens eyes on own,   6 - Follows simple motor commands,   5 - Alert and oriented,   15   INTUBATED no  Sedation protocol: Tylenol ATC   HEENT:  Head: normocephalic, small abrasion left cheek  Eyes: pupils equal and reactive, extraocular eye movements intact

## 2016-02-20 NOTE — Consults
Gait: not assessed    Data Review:  CTH 2200 12/17/16: R frontal SAH with small underlying SDH. No signs of MLS and with patent basal cisterns.  CTH 0445 12/18/16: Redemonstration of R frontal SAH with underlying SDH, stable since last exam. No signs of MLS and with continued patency of the basal cisterns.      Assessment & Recommendations:   [age] M with CAD s/p stenting x2 on daily ASA/Eliquis who presents s/p fall 2/2 syncopal episode with R frontal SAH with repeat 6-hour scan stable. Patient is neurologically intact without focal deficit. The patient was seen and discussed with Dr. Lorenda Cahillahmathulla with the following plan:    -Q1 neurochecks  -HOB >30  -SCDs/TEDs  -Hold DVT chemoprophylaxis  -Na goals 140-145  -SBP <160  -Hold ASA/Eliquis, recommend d/c for 6 weeks post-bleed  -Obtain repeat CTH at 2200 tonight 02/19/16  -ICP management per primary service  -STAT CTH for acute neurologic decline  -Page NSG for acute neurologic decline  -Medical Management per primary team  -Neurosurgery to follow    [facility] StanfordChristopher Menster PA-C  [facility] Department of Neurosurgery  Pager [phone]      Riki AltesChristopher Menster  02/19/2016 7:18 AM

## 2016-02-20 NOTE — Progress Notes
not had follow up labs  Check TSH, Free T3, Free T4- follow up with outpatient endocrine as scheduled    Extremities:  No issues, intact    Disp: d/c home with supervision    Social: Full Code  Wife is Alesia BandaOK, Ellen, [phone]    Quality Checklist:  HOB up 30 degrees: Yes  Chlorhexidine mouth care: No  Adequate glucose control:Yes  Central Line: No  Can Foley be removed:no   GI prophylaxis: po diet  DVT/VTE prophylaxis : sequential compression device(s), mild increase in hemorrhages, will hold chemical DVT ppx for now  Pressure ulcer: No  Mobilize out of bed: Yes  Vent wean assessment completed: N/A    Iona HansenShannon F Terrell, ARNP  02/20/2016  8:13 AM

## 2016-02-20 NOTE — Consults
Department of Neurosurgery    Date of Consult: 12/18/16     Subjective:      Reason for request: SAH/SDH    Alexander Padilla is a 76 y.o. male with history of HTN, HLD, hyperthroidism s/p RAI, A fib, CAD s/p PCI with stenting in 2000 and 2001 on 81mg  ASA and Eliquis daily who is being evaluated at the request of Trauma s/p fall from standing. Patient reports that in the 2 days prior to his fall, he began to have respiratory congestion/cough and experienced a syncopal episode yesterday, falling on his head. +LOC and +amnesia to events. He remembers waking up in [place] ED where CT that was completed at that facility demonstrated R SAH with underlying SDH [facility] which [facility] Patient remained GCS 15 throughout the transfer and [facility] Repeat CTH at 6 hours demonstrated stable ICH without evidence of MLS or downward herniation. He was transferred to SICU for evaluation and further management. The patient denies any currently complaints on interview. He denies any headaches, vision changes, diplopia, memory difficulty, coordination problems, weakness, paresthesias, saddle paresthesias, or loss of b/b function. He doesn't report a history of syncopal episodes in the past, and [place]    Past Medical History:   Diagnosis Date   ? Atrial fibrillation    ? CAD (coronary artery disease)     s/p pci   ? Fall    ? HTN (hypertension)    ? Hyperthyroidism     s/p RAI      Past Surgical History:   Procedure Laterality Date   ? TOTAL HIP ARTHROPLASTY Bilateral      Family History   Problem Relation Age of Onset   ? No Known Problems Mother    ? No Known Problems Father      Social History     Social History   ? Marital status: Married     Spouse name: N/A   ? Number of children: N/A   ? Years of education: N/A     Occupational History   ? Not on file.     Social History Main Topics

## 2016-02-20 NOTE — Consults
Gait: not assessed    Data Review:  CTH 2200 12/17/16: R frontal SAH with small underlying SDH. No signs of MLS and with patent basal cisterns.  CTH 0445 12/18/16: Redemonstration of R frontal SAH with underlying SDH, stable since last exam. No signs of MLS and with continued patency of the basal cisterns.      Assessment & Recommendations:   [age] M with CAD s/p stenting x2 on daily ASA/Eliquis who presents s/p fall 2/2 syncopal episode with R frontal SAH with repeat 6-hour scan stable. Patient is neurologically intact without focal deficit. The patient was seen and discussed with Dr. Lorenda Cahillahmathulla with the following plan:    -Q1 neurochecks  -HOB >30  -SCDs/TEDs  -Hold DVT chemoprophylaxis  -Na goals 140-145  -SBP <160  -Keppra 500mg  BID x7d  -Hold ASA/Eliquis, recommend d/c for 6 weeks post-bleed  -Obtain repeat CTH at 2200 tonight 02/19/16  -ICP management per primary service  -STAT CTH for acute neurologic decline  -Page NSG for acute neurologic decline  -Medical Management per primary team  -Neurosurgery to follow    [facility] Center[facility]opher Menster PA-C  [facility] Department of Neurosurgery  Pager [phone]      Riki AltesChristopher Menster  02/19/2016 7:18 AM

## 2016-02-20 NOTE — Plan of Care
Problem: Risk for Injury R/T Falls  Goal: Prevent Falls  Outcome: Ongoing    Intervention: Assess environment for age appropriate safety  See flowsheet  Intervention: Assess fall risk using appropriate scale  See flowsheet  Intervention: Proper footwear to avoid falls  See flowsheet      Problem: Activity Intolerance  Goal: Demonstrates increased activity tolerance  Outcome: Ongoing    Intervention: Gradually increase activity as appropriate  See flowsheet

## 2016-02-20 NOTE — Progress Notes
ENT: no discharge, swelling or lesions noted     Respiratory:  Pulmonary exam:Clear to auscultation bilaterally  Patient on room air     Cardiovascular:   Rhythm: Atrial fibrillation   Cardiac exam:normal rate, regular rhythm, no murmurs, rubs, clicks or gallops    Other studies (e.g., ECG, ECHO): pending   GI/Nutrition  Abdominal exam:Soft nontender and non-distended, positive bowel sounds   enteral: PO, cardiac diet     Musculoskeltal/Extremities  Extremities exam: extremities normal, atraumatic, no cyanosis or edema   SCDs are present bilateral     Endocrine:   Insulin protocol: No; Drip: No     Infectious Disease/Immunology:  Antimicrobials:    none                                   Cultures:                         SKIN/INTEGUMENTARY EXAM:  Warm, dry and intact   OTHER PERTINENT EXAM:       DEVICES/DRAINS  Peripheral IV 02/19/16 Left Hand (Active)   Site Assessment Clean;Dry;Intact 02/20/2016  8:00 AM   Line Status Saline lock;Flushed;Capped 02/20/2016  8:00 AM   Line Care Alcohol caps changed 02/20/2016  8:00 AM   Dressing Type Transparent;Occlusive 02/20/2016  8:00 AM   Dressing Status Clean;Dry;Intact 02/20/2016  8:00 AM   Dressing Change Due 02/26/16 02/20/2016  8:00 AM   IV Infiltration Grading Scale 0 02/20/2016  8:00 AM   Visual Infusion Phlebitis 0 02/20/2016  8:00 AM   Reason Not Rotated Not due 02/20/2016  8:00 AM       Peripheral IV 02/19/16 Right Anterior Wrist (Active)   Site Assessment Dry;Clean;Intact 02/20/2016  8:00 AM   Line Status Saline lock;Capped;Flushed 02/20/2016  8:00 AM   Line Care Alcohol caps changed 02/20/2016  8:00 AM   Dressing Type Transparent;Occlusive 02/20/2016  8:00 AM   Dressing Status Clean;Dry;Intact 02/20/2016  8:00 AM   Dressing Change Due 02/26/16 02/20/2016  8:00 AM   IV Infiltration Grading Scale 0 02/20/2016  8:00 AM   Visual Infusion Phlebitis 0 02/20/2016  8:00 AM   Reason Not Rotated Not due 02/20/2016  8:00 AM     IN/OUTS    Intake/Output Summary (Last 24 hours) at 02/20/16 [id number]

## 2016-02-20 NOTE — Consults
02/19/16 0300 120/78 36.7 ?C (98.1 ?F) Oral 86 18 97 % 1.727 m (5\' 8" ) 87.2 kg (192 lb 3.9 oz)   02/19/16 0232 125/65 - - 89 22 96 % - -   02/19/16 0200 106/70 - - 88 16 97 % - -   02/19/16 0130 112/72 - - 104 27 95 % - -   02/19/16 0115 114/67 - - 103 20 94 % - -   02/19/16 0100 114/60 - - 101 13 93 % - -   02/19/16 0045 112/61 - - 109 24 94 % - -   02/19/16 0030 113/61 - - 110 24 94 % - -   02/19/16 0015 115/61 - - 96 24 94 % - -   02/19/16 0010 115/73 - - 112 21 95 % - -   02/19/16 0005 110/67 - - 118 25 94 % - -   02/19/16 0000 106/70 - - 99 25 93 % - -   02/18/16 2355 118/68 37.6 ?C (99.6 ?F) Oral 104 26 94 % - -     Body mass index is 29.23 kg/(m^2).    Pain Score  Pain Rating ([facility] Only) : 1      Intake/Output Summary (Last 24 hours) at 02/19/16 0718  Last data filed at 02/19/16 0600   Gross per 24 hour   Intake              480 ml   Output                0 ml   Net              480 ml     Physical Exam:  NAD  Well-appearing, well-nourished, answering questions appropriately.  PERRL, EOMs intact b/l.  Neurologic:    Patient is alert and oriented to person, place and time.  CNII-XII intact b/l  Motor: No involuntary movements. No pronator drift.   Muscle: Normal tone, no fasciculations, no tremor  Muscle Strength  Right  Left    Deltoid  5/5  5/5    Biceps  5/5  5/5    Triceps  5/5  5/5    Wrist extensors  5/5  5/5    Grip  5/5  5/5    Finger extensors  5/5  5/5    Iliopsoas  5/5  5/5    Hamstrings  5/5  5/5    Quadriceps  5/5  5/5    Tibialis anterior  5/5  5/5    Gastrocnemius  5/5  5/5    Cerebellar: Normal coordination with finger-to-nose and heel-knee-shin testing (no dysmetria). Normal finger/toe tapping and rapid alternating movement (no dysdiadochokinesia)  Reflexes:   DTR's  Right  Left    Brachioradialis  2  2    Biceps  2  2    Triceps  2  2    Knee  3  3   Ankle  2  2    Hoffman's  Absent  Absent    Babinski  Down  Down    Clonus: absent bilaterally  Sensory: Light touch intact throughout.

## 2016-02-20 NOTE — Consults
?   Smoking status: Light Tobacco Smoker     Start date: 01/04/1978   ? Smokeless tobacco: Never Used   ? Alcohol use Not on file      Comment: occasionally   ? Drug use: No   ? Sexual activity: Not on file     Other Topics Concern   ? Not on file     Social History Narrative   ? No narrative on file     I have reviewed the past medical, surgical, family and social history.    Home Medications:  Prescriptions Prior to Admission   Medication Sig   ? apixaban (ELIQUIS) 5 MG PO Tablet Take 5 mg by mouth 2 times daily.   ? aspirin 81 MG PO Tablet Take 81 mg by mouth daily.   ? simvastatin (ZOCOR) 20 MG PO Tablet Take 20 mg by mouth nightly at bedtime.     Current Hospital Medications:  Scheduled:   ? acetaminophen  1,000 mg Oral Q6H SCH   ? levETIRAcetam  500 mg Oral Q12H SCH   ? [START ON 02/20/2016] metoprolol tartrate  50 mg Oral BID   ? mupirocin   Nasal BID   ? pneumococcal conjugate vaccine, PCV13  0.5 mL Intramuscular Once   ? [START ON 02/20/2016] pravastatin  10 mg Oral Nightly     Continuous Infusions:   PRN: dextrose, glucose, oxyCODONE, perflutren lipid microspheres  Allergies:  Review of patient's allergies indicates no known allergies.    Review of Systems:  Constitutional: negative for fevers, chills, sweats, fatigue, malaise, anorexia and weight loss  Musculoskeletal:negative for myalgias, arthralgias, stiff joints, neck pain, back pain, muscle weakness and bone pain  Neurological: positive for memory problems, negative for headaches, dizziness, vertigo, memory problems, speech problems, paresthesia, coordination problems, gait problems, tremor, weakness and numbness    Objective:     Patient Vitals for the past 8 hrs:   BP Temp Temp src Pulse Resp SpO2 Height Weight   02/19/16 0600 119/69 - - 85 23 96 % - -   02/19/16 0500 121/82 - - 94 18 95 % - -   02/19/16 0457 (!) 126/94 - - 105 18 96 % - -   02/19/16 0400 118/77 36.7 ?C (98.1 ?F) Oral 90 16 98 % - -

## 2016-02-20 NOTE — Plan of Care
[age] M with CAD s/p stenting x2 on daily ASA/Eliquis who presents s/p fall 2/2 syncopal episode with R frontal SAH with repeat 6-hour scan stable. Patient obtained 24-hour scan at 2200 which was reviewed with Dr. Lorenda Cahillahmathulla with the following recommendations:    -Hold ASA/Eliquis, recommend d/c for 4 weeks  -Recommend primary team discuss with cardiology or the patient's cardiologist regardining feasability of d/c antiplatelet/anticoagulation for 6 weeks.  -STAT CTH for acute neurologic decline  -Page NSG for acute neurologic decline  -Medical Management per primary team  -Neurosurgery to sign off    [facility] Center[facility]opher Menster PA-C  [facility] Department of Neurosurgery  Pager [phone]

## 2016-02-20 NOTE — Plan of Care
Problem: Risk for Injury R/T Falls  Goal: Prevent Falls  Outcome: Ongoing  The bed remains in the lowest position to the floor with rails 3/4 up while patient is in bed. The door remains open, call light in reach and the nurse hourly rounds for safety. Lights remain on in room throughout day with blinds open.

## 2016-02-20 NOTE — Progress Notes
Last data filed at 02/20/16 0200   Gross per 24 hour   Intake              300 ml   Output              675 ml   Net             -375 ml        LAB STUDIES  I have reviewed the lab studies today yes  The pertinent labs are:   CBC (with or without Differential):   Lab Results   Component Value Date    WBC 5.58 02/20/2016    HGB 12.1 (L) 02/20/2016    HCT 35.9 (L) 02/20/2016    MCV 88.9 02/20/2016    MCH 30.0 02/20/2016    MCHC 33.7 02/20/2016    RDW 12.8 02/20/2016    PLATCOUNT 130 (L) 02/20/2016    MPV 10.2 02/20/2016    NEUTROPCT 76.4 (H) 02/20/2016    MONOPCT 9.7 (H) 02/20/2016    EOSPCT 2.2 02/20/2016    BASOPCT 0.2 02/20/2016    and BMP/CMP:   Lab Results   Component Value Date    NA 140 02/20/2016    K 3.9 02/20/2016    CL 100 (L) 02/20/2016    CO2 26 02/20/2016    BUN 21 02/20/2016    CREATININE 0.82 02/20/2016    GLU 118 (H) 02/20/2016    CALCIUM 8.5 (L) 02/20/2016    EGFR >59 02/20/2016    MG 1.9 02/18/2016     Thyroid Studies    Lab Results   Component Value Date    TSH 0.049 (L) 02/20/2016    FREET4 1.00 02/20/2016    T3FREE 2.5 02/20/2016         ASSESSMENT:  Active Hospital Problems    Diagnosis Date Noted   ? *Subarachnoid hematoma 02/19/2016   ? Syncope 02/19/2016   ? On anticoagulant therapy 02/19/2016   ? Chronic atrial fibrillation 02/19/2016      Resolved Hospital Problems    Diagnosis Date Noted Date Resolved   No resolved problems to display.         PLAN:  Neuro:  Continue q1h neurochecks  Keppra 561m BID x7 days  Hold ASA and Eliquis  NSG following  Syncope work up    Resp:  Room air  4 day history of cough- obtain CXR    CV:  Hx of Afib- continue Metoprolol, hold Eliquis  Hx of CAD- stents in 2000, 2001, cont to hold ASA and Eliquis  Echo pending  Check orthostatic VS    Fluids/Electrolytes:  No IVF    Nutrition:  Cardiac diet      Heme:  No anemia  Mild thrombocytopenia, monitor    ID: afebrile    Endocrine:   Hx of hyperthyroidism- underwent radioactive iodine ablation 10/17, has

## 2016-02-20 NOTE — Progress Notes
Check TSH, Free T3, Free T4- follow up with outpatient endocrine as scheduled    Extremities:  No issues, intact    Disp: d/c home with supervision    Social: Full Code  Wife is Alesia BandaOK, Ellen, [phone]    Quality Checklist:  HOB up 30 degrees: Yes  Chlorhexidine mouth care: No  Adequate glucose control:Yes  Central Line: No  Can Foley be removed:no   GI prophylaxis: po diet  DVT/VTE prophylaxis : sequential compression device(s), mild increase in hemorrhages, will hold chemical DVT ppx for now  Pressure ulcer: No  Mobilize out of bed: Yes  Vent wean assessment completed: N/A    Iona HansenShannon F Terrell, ARNP  02/20/2016  8:13 AM

## 2016-02-20 NOTE — Progress Notes
Last data filed at 02/20/16 0200   Gross per 24 hour   Intake              300 ml   Output              675 ml   Net             -375 ml        LAB STUDIES  I have reviewed the lab studies today yes  The pertinent labs are:   CBC (with or without Differential):   Lab Results   Component Value Date    WBC 5.58 02/20/2016    HGB 12.1 (L) 02/20/2016    HCT 35.9 (L) 02/20/2016    MCV 88.9 02/20/2016    MCH 30.0 02/20/2016    MCHC 33.7 02/20/2016    RDW 12.8 02/20/2016    PLATCOUNT 130 (L) 02/20/2016    MPV 10.2 02/20/2016    NEUTROPCT 76.4 (H) 02/20/2016    MONOPCT 9.7 (H) 02/20/2016    EOSPCT 2.2 02/20/2016    BASOPCT 0.2 02/20/2016    and BMP/CMP:   Lab Results   Component Value Date    NA 140 02/20/2016    K 3.9 02/20/2016    CL 100 (L) 02/20/2016    CO2 26 02/20/2016    BUN 21 02/20/2016    CREATININE 0.82 02/20/2016    GLU 118 (H) 02/20/2016    CALCIUM 8.5 (L) 02/20/2016    EGFR >59 02/20/2016    MG 1.9 02/18/2016     Thyroid Studies    Lab Results   Component Value Date    TSH 0.049 (L) 02/20/2016    FREET4 1.00 02/20/2016    T3FREE 2.5 02/20/2016         ASSESSMENT:  Active Hospital Problems    Diagnosis Date Noted   ? *Subarachnoid hematoma 02/19/2016   ? Syncope 02/19/2016   ? On anticoagulant therapy 02/19/2016   ? Chronic atrial fibrillation 02/19/2016      Resolved Hospital Problems    Diagnosis Date Noted Date Resolved   No resolved problems to display.         PLAN:  Neuro:  Continue q1h neurochecks  Keppra 5103m BID x7 days  Hold ASA and Eliquis  NSG following  Syncope work up    Resp:  Room air  4 day history of cough- obtain CXR    CV:  Hx of Afib- continue Metoprolol, hold Eliquis  Hx of CAD- stents in 2000, 2001, cont to hold ASA and Eliquis  Echo pending    Fluids/Electrolytes:  No IVF    Nutrition:  Cardiac diet      Heme:  No anemia  Mild thrombocytopenia, monitor    ID: afebrile    Endocrine:   Hx of hyperthyroidism- underwent radioactive iodine ablation 10/17, has not had follow up labs

## 2016-02-20 NOTE — Progress Notes
02/20/16 1222   Vitals   Pulse 81   Heart Rate Source Monitor   Heart Rate 87   Resp 16   BP 150/87   BP Method Automatic   NIBP MAP  99 mmHg   Oxygen Therapy   SpO2 96 %   Vital Signs sitting up on bed.

## 2016-02-20 NOTE — Plan of Care
[age] M with CAD s/p stenting x2 on daily ASA/Eliquis who presents s/p fall 2/2 syncopal episode with R frontal SAH with repeat 6-hour scan stable. Patient obtained 24-hour scan at 2200 which was reviewed with Dr. Lorenda Cahillahmathulla with the following recommendations:    -Hold ASA/Eliquis, recommend d/c for 4 weeks  -Recommend primary team discuss with cardiology or the patient's cardiologist regardining feasability of d/c antiplatelet/anticoagulation for 6 weeks.  -Recommend repeat CTH in 4-6 weeks  -Follow-up with [facility] trauma clinic in 4-6 weeks.  -STAT CTH for acute neurologic decline  -Page NSG for acute neurologic decline  -Medical Management per primary team  -Neurosurgery to sign off    [facility]opher Menster PA-C  [facility] Department of Neurosurgery  Pager [phone]

## 2016-02-20 NOTE — Progress Notes
This note also relates to the following rows which could not be included:  NIBP MAP  - Cannot attach notes to unvalidated device data       02/20/16 1220   Vitals   Pulse 83   Heart Rate Source Monitor   Heart Rate 81   Resp 19   BP 132/85   BP Method Automatic   Oxygen Therapy   SpO2 97 %   Vital signs lying flat

## 2016-02-21 DIAGNOSIS — I4891 Unspecified atrial fibrillation: Principal | ICD-10-CM

## 2016-02-21 DIAGNOSIS — W19XXXA Unspecified fall, initial encounter: Secondary | ICD-10-CM

## 2016-02-21 DIAGNOSIS — I10 Essential (primary) hypertension: Secondary | ICD-10-CM

## 2016-02-21 DIAGNOSIS — E0590 Thyrotoxicosis, unspecified without thyrotoxic crisis or storm: Secondary | ICD-10-CM

## 2016-02-21 DIAGNOSIS — I2510 Atherosclerotic heart disease of native coronary artery without angina pectoris: Secondary | ICD-10-CM

## 2016-02-21 MED ORDER — MELATONIN 3 MG PO TABS
3 mg | Freq: Every evening | ORAL | Status: DC
Start: 2016-02-21 — End: 2016-02-24

## 2016-02-21 NOTE — Plan of Care
Problem: Risk for Injury R/T Falls  Goal: Use a bed and/or chair alarm    Intervention: Fall precautions (yellow armband, signage posted, low bed, call bell within reach, three side rails on bed, bed lock, bed/chair alarm, walking aids, declutter room, etc.)  Fall precautions in place, call bell in reach.      Problem: Knowledge Deficit Stroke  Goal: Understand s/s stroke, risk factors for stroke, how to activate EMS, stroke specific medications, plan for follow up care  Outcome: Ongoing  Pt has stroke folder at bedside.

## 2016-02-22 NOTE — Progress Notes
VTE PROPHYLAXIS: sequential compression device(s)               Hold chemo DVT ppx  At this time per NSG   Mobility: Pressure ulcer present: No                  OOB with assistance and                   PT/OT    Disp:  The patient has been downgraded to the floor and awaiting bed placement    Social: Full code       [place]and[place] S Galician, MD  02/21/2016  12:27 PM

## 2016-02-22 NOTE — Progress Notes
Department of Surgery  Division of Acute Care Surgery  SURGICAL CRITICAL CARE ICU PROGRESS NOTE      Admit Date: 02/18/2016   LOS: 2 days     Post op Day: * No surgery found *,    Admitting service: Alexander Padilla    Subjective:     Hospital course before SICU admission: Admitted from [facility]uma [facility]    Reason for ICU Admission:  Traumatic brain injury      Reason for continued ICU Management:  Alexander Padilla has improved and is downgraded awaiting bed availibility         Events of past 24 hours: I have reviewed the patient?s course, labs, medications, and previous notes.  In the past 24 hours the patient has continued to remain HDS without any acute events. He repoerts continued pain over the occiput, posterior neck, right jaw, and right shoulder, with a score of 4/10. He is tolerating a regular diet without c/o N/V. He is satting well on RA, and denies dizziness, headache, visual changes, SOB, and CP.     INJURIES/PROBLEMS  Active Hospital Problems    Diagnosis Date Noted   ? *Subarachnoid hematoma 02/19/2016   ? SDH (subdural hematoma) 02/20/2016   ? Syncope 02/19/2016   ? On anticoagulant therapy 02/19/2016   ? Chronic atrial fibrillation 02/19/2016      Resolved Hospital Problems    Diagnosis Date Noted Date Resolved   No resolved problems to display.     Current Hospital Medications  Reviewed  Scheduled Medications  ? levETIRAcetam  500 mg Oral Q12H SCH   ? melatonin  3 mg Oral Nightly   ? metoprolol tartrate  50 mg Oral BID   ? mupirocin   Nasal BID   ? pravastatin  10 mg Oral Nightly     PRN Medications    dextrose 30 mL Intravenous PRN   traMADol 50 mg Oral Q8H PRN     Continuous infusions    Objective Data and Assessment:       Vital Signs: Last Filed Vitals Signs: 24 Hour Range   BP: 145/82 (02/17 1100)  Temp: 36.8 ?C (98.2 ?F) (02/17 0400)  Resp: 19 (02/17 1100)  SpO2: 98 % (02/17 1100)   Temp:  [36.6 ?C (97.9 ?F)-36.8 ?C (98.3 ?F)] 36.8 ?C (98.2 ?F)  Pulse:  [72-96] 79  Resp:  [12-22] 19

## 2016-02-22 NOTE — Progress Notes
VTE PROPHYLAXIS: sequential compression device(s)               Hold chemo DVT ppx  At this time per NSG   Mobility: Pressure ulcer present: No                  OOB with assistance and                   PT/OT    Disp:  The patient has been downgraded to the floor and awaiting bed placement    Social: Full code       Alexander S Galician, MD  02/21/2016  12:27 PM    ATTENDING NOTE    I have seen and evaluated the patient.  I have discussed with the resident and agree with resident?s findings and plan as documented in the resident?s note.     Cathlean MarseillesJoseph R Shiber, MD  02/22/2016  7:17 AM

## 2016-02-22 NOTE — Progress Notes
BP: (110-154)/(64-105) 145/82      CNS/Neuro:   Exam:  Alert  oriented to time, place and person    Pain Scale: 5/10 / 10    GCS:   4 - Opens eyes on own,   6 - Follows simple motor commands,   5 - Alert and oriented,   15   INTUBATED no     HEENT:  Head: normocephalic, Atraumatic  Eyes: pupils equal and reactive, extraocular eye movements intact  ENT: no discharge, swelling or lesions noted     Respiratory:  Pulmonary exam:Clear to auscultation bilaterally  RA 95%   Cardiovascular:   Rhythm: Normal Sinus, HR 73 on exam    Cardiac exam:normal rate and regular rhythm       GI/Nutrition  Abdominal exam:Soft, non-distended, non tender to palpation, positive bowel sounds   Nutrition route: PO  Regular diet    Musculoskeltal/Extremities  Extremities exam: extremities normal, atraumatic, no cyanosis or edema   SCDs are present bilateral     Endocrine:   Insulin protocol: No; Drip: No     SKIN/INTEGUMENTARY EXAM: small abrasion healed over left cheek       DEVICES/DRAINS  Peripheral IV 02/19/16 Left Hand (Active)   Site Assessment Clean;Dry;Intact 02/21/2016 12:00 PM   Line Status Saline lock;Flushed;Capped 02/21/2016 12:00 PM   Line Care Connections checked and tightened 02/21/2016 12:00 PM   Dressing Type Transparent;Occlusive 02/21/2016 12:00 PM   Dressing Status Clean;Dry;Intact 02/21/2016 12:00 PM   Dressing Change Due 02/26/16 02/21/2016 12:00 PM   IV Infiltration Grading Scale 0 02/21/2016 12:00 PM   Visual Infusion Phlebitis 0 02/21/2016 12:00 PM   Reason Not Rotated Not due 02/21/2016 12:00 PM       Peripheral IV 02/19/16 Right Anterior Wrist (Active)   Site Assessment Dry;Clean;Intact 02/21/2016 12:00 PM   Line Status Saline lock;Capped;Flushed 02/21/2016 12:00 PM   Line Care Connections checked and tightened 02/21/2016 12:00 PM   Dressing Type Transparent;Occlusive 02/21/2016 12:00 PM   Dressing Status Clean;Intact;Dry 02/21/2016 12:00 PM   Dressing Change Due 02/26/16 02/21/2016 12:00 PM

## 2016-02-22 NOTE — Progress Notes
IV Infiltration Grading Scale 0 02/21/2016 12:00 PM   Visual Infusion Phlebitis 0 02/21/2016 12:00 PM   Reason Not Rotated Not due 02/21/2016 12:00 PM     IN/OUTS    Intake/Output Summary (Last 24 hours) at 02/21/16 1227  Last data filed at 02/21/16 1200   Gross per 24 hour   Intake              500 ml   Output             2175 ml   Net            -1675 ml        LAB STUDIES  I have reviewed the lab studies today yes    Infectious Disease/Immunology:  I have reviewed the culture data for Colgate Palmolivelbert J Smallman .NA  I have reviewed the antimicrobial therapy for Loel Loftylbert J Schorsch NA      ASSESSMENT:  Active Hospital Problems    Diagnosis Date Noted   ? *Subarachnoid hematoma 02/19/2016   ? SDH (subdural hematoma) 02/20/2016   ? Syncope 02/19/2016   ? On anticoagulant therapy 02/19/2016   ? Chronic atrial fibrillation 02/19/2016      Resolved Hospital Problems    Diagnosis Date Noted Date Resolved   No resolved problems to display.         PLAN/ QUALITY CHECKLIST REVIEW:    Neuro: GCS 15               Tylenol 1000mg  Q6H                Keppra 500mg  BID x 7 days( started 2/15)                Tramadol 50mg  Q8H                      Resp: RA  Satting 95%   VAP PREVENTION:    HOB up 30 degrees: Yes   Chlorhexidine mouth care: No    CV:  NSR/ Mild Tachycardia ,HDS          Metoprolol 50 mg BID          Pravastatin 10mg  nightly          Hold eliquis/ ASA per NSG for 6 weeks  Fluids: I have reviewed the cumulative fluid balance for the past 24 hours. The net fluid balance is negative    Electrolytes: Replace as needed     Nutrition: Diet: ( Cardiac , fat, sod, caff restricted)   STRESS ULCER PROPHYLAXIS: not required at this time     Heme:    No resuscitation required at this time    ID: Afebrile   NOSOCOMIAL INFECTION PREVENTION:   Central Line: No    Foley catheter in place: No.      Endocrine:     ADEQUATE GLUCOSE CONTROL The patient does have adequate glucose control.     Extremities:

## 2016-02-22 NOTE — Plan of Care
Problem: Risk for Injury R/T Falls  Goal: Prevent Falls  Outcome: Ongoing  Yellow armband on and sign on door side rails up x 4, call light within reach, encouraged patient to call for assistance, bed in lowest position, non skid slippers on.

## 2016-02-23 MED ORDER — LEVETIRACETAM 500 MG PO TABS
500 mg | Freq: Two times a day (BID) | ORAL | 0 refills | Status: CP
Start: 2016-02-23 — End: ?

## 2016-02-23 MED ORDER — METOPROLOL TARTRATE 50 MG PO TABS
50 mg | Freq: Two times a day (BID) | ORAL | 0 refills | Status: CP
Start: 2016-02-23 — End: ?

## 2016-02-23 MED ORDER — TRAMADOL HCL 50 MG PO TABS
50 mg | Freq: Three times a day (TID) | ORAL | 0 refills | Status: CP | PRN
Start: 2016-02-23 — End: ?

## 2016-02-23 MED ORDER — PHARMACY CONSULT-ICU ELECTROLYTE PROTOCOL JX
1 | Status: DC | PRN
Start: 2016-02-23 — End: 2016-02-24

## 2016-02-23 NOTE — Plan of Care
Problem: Risk for Injury R/T Falls  Goal: Prevent Falls  Outcome: Ongoing  Bed low and locked at all times, bedside table and call light within reach. Nutrition and hydration provided as needed.

## 2016-02-23 NOTE — Progress Notes
HOW MUCH HELP FROM ANOTHER PERSON DOES THE PATIENT CURRENTLY NEED?    4. Moving to and from a bed to a chair (including a wheelchair)?  5. Need help to walk in hospital room? 4  4   Scoring: 1 = total (dependent)/unable; 2 = a lot (max/mod assist); 3 = a little (min assist/CGA/SBA/SUP); 4 = none (independent or modified independent).    AM-PAC BASIC MOBILITY SCALE SHORT FORM (without stairs): Raw Score: 20. AM-PAC Score: 60.57. CMS Score: 0.00% - [facility]      Assessment: Patient is independent with functional mobility.  Demonstrated improvement in ambulation and balance compared to last visit.  Pt has met necessary PT goals and no longer requires PT services.  Pt is safe to d/c home.    Other Recommended Services: none    Discharge Recommendations:   ? Discharge Disposition: Home with intermittent caregiver supervision    ? DME Recommendations:  1. None, pt owns RW    **Note: Discharge recommendations may change based on patient progress. Please refer to the most updated progress note for current discharge recommendations.     Post Treatment:   Patient Position/Safety: Sitting in recliner    Plan: Patient will be discharged from PT services.    _________________________     Olena Leatherwood, PT   02/23/2016

## 2016-02-23 NOTE — Progress Notes
Occupational Therapy Treatment Note      Start Time (min): 914  End Time (min): 929  Total Time (min): 15  Total Coded Time (min): 15    Room/Bed: 8033/8033-02  Admit Date: 02/18/2016  Current Medical Condition: No changes    Past Medical History:   Diagnosis Date   ? Atrial fibrillation    ? CAD (coronary artery disease)     s/p pci   ? Fall    ? HTN (hypertension)    ? Hyperthyroidism     s/p RAI     Past Surgical History:   Procedure Laterality Date   ? TOTAL HIP ARTHROPLASTY Bilateral        Precautions:  ? FALL    Extremity Precautions:  ? None indicated    Orthotic, Protective, & Supportive Devices:  ? None    Subjective: Pt/RN consents to therapy, denies pain, wants to go home.  ? Pain Pre-Treatment: No, 0/10  ? Pain Post-Treatment: No, 0/10    **Was an interpreter used: NA    Objective:  ? Observation: supine in bed, ICU monitors  ? Cognition: alert, oriented x 4, goo safety awareness    Vitals: Vitals stable    ADLs: Pt able to donn back hospital gown in standing with modif ind Pt completed grooming tasks in standing at sinkside with independence. Pt completed simulated toileting using hospital toilet with modif ind.  ADL Transfers/Functional mobility: Pt completed supine to/from sitting EOB with independence. Pt completed STS from EOB and functional mobility w/o AD with independence. No LOB and no knee buckling noted this session.    Post Treatment:   Patient Position/safety: Sitting in recliner, Call Bell within reach, Tray table within reach, All lines and leads intact, Handoff to nurse on patient position    Outcome Measures:  ? AM-PAC "6-clicks" Short Form: Raw Score: 24. AM-PAC Score: 57.54. CMS Score: 0.00% - CH    Special Tests:  ? NA    Assessment: Patient is back to his baseline with ADLs and demo improved standing balance and tolerance for functional mobility. Pt is safe to dc home with family when medically cleared by MD. No further acute OT needs at this time    Discharge Recommendations:

## 2016-02-23 NOTE — Progress Notes
Prealbumin lab not drawn, patient requested not to be disturbed if sleeping. Waiting to be transferred off the unit, patient wanting to go home instead. Will endorse to am RN.

## 2016-02-23 NOTE — Plan of Care
Problem: Risk for Injury R/T Falls  Goal: Prevent Falls  Outcome: Ongoing  Patient in bed side rails up X 4, and bed in lowest postion. Nurse call button within reach and patient instructed to call nurse before attempting to get out of bed

## 2016-02-23 NOTE — Progress Notes
?   Discharge Disposition: Home with intermittent caregiver supervision    ? DME Recommendations:  1. Already has necessary DMEs    **Note: Discharge recommendations may change based on patient progress. Please refer to the most updated progress note for current discharge recommendations.     Plan: Patient will be discharged from OT.  _________________________  Malachi ParadiseSheena Kristine Estalilla, OT  02/23/2016

## 2016-02-23 NOTE — Progress Notes
TRAUMA SERVICE DAILY PROGRESS NOTE    DATE: 02/19/2016  TIME: 2:10 PM        SUBJECTIVE  Events of past 24 hours: I have reviewed the patient?s course.   In the past 24 hours the patient has had admittance following fall. Syncopal workup in progress. NSG consulted - repeat CT head tonight      OBJECTIVE  I have reviewed the vital signs  Vitals:    02/19/16 1100 02/19/16 1200 02/19/16 1300 02/19/16 1400   BP: 108/68 116/65 114/73 122/86   Pulse: 94 100 87 108   Resp: 12 19 14 17    Temp:  36.9 ?C (98.5 ?F)     TempSrc:       SpO2: 94% 94% 96% 96%   Weight:       Height:             PHYSICAL EXAM  BP 122/86 - Pulse 108 - Temp 36.9 ?C (98.5 ?F) - Resp 17 - Ht 1.727 m (5\' 8" ) - Wt 87.2 kg (192 lb 3.9 oz) - SpO2 96% - BMI 29.23 kg/m2  General appearance: alert, cooperative, no distress, appears stated age  Lungs: clear to auscultation bilaterally  Heart: regular rate and rhythm, S1, S2 normal, no murmur, click, rub or gallop  Abdomen: soft, non-tender; bowel sounds normal; no masses, no organomegaly  Extremities: extremities normal, atraumatic, no cyanosis or edema  Pulses: 2+ and symmetric        ASSESSMENT  Active Hospital Problems    Diagnosis Date Noted   ? *Subarachnoid hematoma 02/19/2016   ? Syncope 02/19/2016   ? On anticoagulant therapy 02/19/2016   ? Chronic atrial fibrillation 02/19/2016      Resolved Hospital Problems    Diagnosis Date Noted Date Resolved   No resolved problems to display.           PLAN   I have reviewed the care and management decisions with the Surgical Critical Care service.  -No operative intervention required at this time per Trauma Surgery  -Continue care per Surgical Critical Care team  - Repeat CT head tonight per NSG  - Hold elequis/ASA        Erskine EmeryMichael Collins, MD  02/19/2016  2:10 PM

## 2016-02-23 NOTE — Plan of Care
Problem: ADL - Grooming (OT)  Goal: OT STG :  Patient will complete grooming tasks:   STG:  Patient will complete grooming at Standing level with assistive device with Supervision (S).   Outcome: Met/Completed Date Met: 02/23/16      Problem: ADL - Toileting (OT)  Goal: OT STG :  Patient will complete toileting tasks:   STG:  Patient will complete toileting tasks with Supervision (S), including toilet transfers using LRAD.   Outcome: Met/Completed Date Met: 02/23/16      Problem: IADL  (OT)  Goal: OT STG :  Patient will complete functional mobility in simulated home environment:   STG:  Patient will demonstrate safe mobility utilizing adaptive techniques in simulated home environment using Least restrictive device adhering to applicable precautions with Supervision (S) for increased independence with ADLs and mobility.   Outcome: Met/Completed Date Met: 02/23/16      Problem: Impaired Function (OT)  Goal: OT STG: Patient will demonstrate improved function  STG: Patient will improve self care AM-PAC to 22/24 to improve overall functional independence with ADLs.   Outcome: Met/Completed Date Met: 02/23/16

## 2016-02-23 NOTE — Plan of Care
Problem: Impaired bed mobility (PT)  Goal: PT STG : Patient will transition supine to/from sit in bed from left/right:   Patient will transition supine to/from sit in bed from bilateral with Independence (I) and no cues to prepare for OOB mobility.   Outcome: Met/Completed Date Met: 02/23/16      Problem: Impaired gait (PT)  Goal: PT STG : Patient will ambulate over even surfaces:   Patient will ambulate 200 feet over even surfaces with Rolling walker (RW) as needed, Modified Independence (Mod I) and no cues to mobilize in the home and community.    Outcome: Met/Completed Date Met: 02/23/16      Problem: Impaired balance (PT)  Goal: PT LTG : Patient will maintain static sitting balance in midline by discharge :  Patient will increase his AM Pac Mobility Scale score from 16/20 to 19/20 to return home safely upon d/c.    Outcome: Met/Completed Date Met: 02/23/16    Goal: PT STG : Patient will maintain dynamic standing balance within/outside base of support :   STG - Patient will maintain dynamic standing balance outside base of support during functional tasks with Supervision (S), No assistive device and no cues to reduce risk for falls during mobility.    Outcome: Met/Completed Date Met: 02/23/16

## 2016-02-23 NOTE — Progress Notes
Physical Therapy Treatment Note       Start Time (min): 1055  End Time (min): 1105  Total Coded Time (min): 10    Room/Bed: 8033/8033-02  Admit Date: 02/18/2016  Current Medical Condition: No changes     Past Medical History:   Diagnosis Date   ? Atrial fibrillation    ? CAD (coronary artery disease)     s/p pci   ? Fall    ? HTN (hypertension)    ? Hyperthyroidism     s/p RAI     Past Surgical History:   Procedure Laterality Date   ? TOTAL HIP ARTHROPLASTY Bilateral        Precautions:  ? FALL    Extremity Precautions:  ? None indicated    Orthotic, Protective, & Supportive Devices:  ? None    Subjective: Pt agreed to PT, no c/o pain    **Was an interpreter used: NA    Objective:     Observation: Pt presented supine in bed, on room air, family present    Vitals: Vitals stable    Cognition:   ? Alert and Oriented to Person, Place, Time and Situation  ? Command Following: Follows Multi-step commands    Education: PT areas patient educated on: PT role and POC    Functional/Therapeutic Activities:  Bed Mobility:  ? Not Tested: Pt OOB upon treatment  Transfers:  ? Sit to/from Stand: Independent (I) with No assistive device  Locomotion:  ? GAIT: Patient ambulated 200 ft with No assistive device, gait belt, Independent (I). Gait Quality: ambulated without difficulty, no LOB with head turns, no LOB when turning 360 deg    Balance:   ? Sitting: Static  Independent (I), Dynamic  Independent (I)  ? Standing: Static  Independent (I), Dynamic  Independent (I)    Therapeutic Exercises: none     Outcome Measures:  [place] AM-PAC Basic Mobility Inpatient Short Form   (Without Stair Climbing):  HOW MUCH DIFFICULTY DOES THE PATIENT CURRENTLY HAVE? SCORE   1. Turning over in bed (including adjusting bedclothes, sheets and blankets)?  2. Moving from lying on back to sitting on the side of the bed?  3. Sitting down on and standing up from a chair with arms? 4  4  4

## 2016-02-24 NOTE — Discharge Summary
Department of Surgery      Alexander Padilla is a 76 y.o. male patient who is being discharged.    Admit date: 02/18/2016    Discharge date and time: No discharge date for patient encounter.     Admitting Physician: Mariane Duvalavid J Skarupa, MD     Attending Physician: Mariane DuvalSkarupa, David J, MD     Discharge Physician: Cristine PolioAndrew Kerwin , MD     Admission Diagnoses: Subarachnoid hematoma    Secondary Diagnoses:   Patient Active Problem List    Diagnosis Date Noted   ? SDH (subdural hematoma) 02/20/2016   ? Syncope 02/19/2016   ? Subarachnoid hematoma 02/19/2016   ? On anticoagulant therapy 02/19/2016   ? Chronic atrial fibrillation 02/19/2016        Complications:none    Discharge Diagnoses: Subarachnoid hematoma    Operations and Major Procedures: none    Significant Findings: none    Hospital Course:   Alexander Padilla is a 76 y.o.  male with PMH of CAD and A fib , s/p stent placement x2 who fell at home from standing height, and suffered LOC with associated amnesia on 02/18/16.The [facility] after imaging was performed which demostrated a SAH . Upon arrival  to [facility] on 2/14  repeat CT of the Head w/o contrast demonstrated a right hemispheric extra-axial hemorrhage measuring measuring up to 6 and 5 mm from the inner table overlying the right frontal and parietal convexities, and scattered supratentorial subarachnoid hemorrhage as above, and no midline shift. On 2/16 repeat CT of the Head w/o contrast demonstrated a mild interval increase in size of the right frontal interhemispheric falx subdural hematoma and right frontal intraparenchymal hemorrhagic contusion with surrounding vasogenic edema, and stable appearance of right hemispheric convexity subdural hematoma and scattered areas of subarachnoid hemorrhage. The patient remained stable and was downgraded from the SICU unit the following day. The patient remained in the SICU unit due to limited floor bed availibility. The patient was seen

## 2016-02-24 NOTE — Discharge Summary
Department of Surgery      Alexander Padilla a 76 y.o. male patient who is being discharged.    Admit date: 02/18/2016    Discharge date and time: No discharge date for patient encounter.     Admitting Physician: Mariane Duvalavid J Skarupa, MD     Attending Physician: Mariane DuvalSkarupa, David J, MD     Discharge Physician: Cristine PolioAndrew Kerwin , MD     Admission Diagnoses: Subarachnoid hematoma    Secondary Diagnoses:   Patient Active Problem List    Diagnosis Date Noted   ? SDH (subdural hematoma) 02/20/2016   ? Syncope 02/19/2016   ? Subarachnoid hematoma 02/19/2016   ? On anticoagulant therapy 02/19/2016   ? Chronic atrial fibrillation 02/19/2016        Complications:none    Discharge Diagnoses: Subarachnoid hematoma    Operations and Major Procedures: none    Significant Findings: none    Hospital Course:   [name] is a 76 y.o.  male with PMH of CAD and A fib , s/p stent placement x2 who fell at home from standing height, and suffered LOC with associated amnesia on 02/18/16.The [facility] after imaging was performed which demostrated a SAH . Upon arrival  to [facility] on 2/14  repeat CT of the Head w/o contrast demonstrated a right hemispheric extra-axial hemorrhage measuring measuring up to 6 and 5 mm from the inner table overlying the right frontal and parietal convexities, and scattered supratentorial subarachnoid hemorrhage as above, and no midline shift. On 2/16 repeat CT of the Head w/o contrast demonstrated a mild interval increase in size of the right frontal interhemispheric falx subdural hematoma and right frontal intraparenchymal hemorrhagic contusion with surrounding vasogenic edema, and stable appearance of right hemispheric convexity subdural hematoma and scattered areas of subarachnoid hemorrhage. The patient remained stable and was downgraded from the SICU unit the following day. The patient remained in the SICU unit due to limited floor bed availibility. The patient was seen

## 2016-02-24 NOTE — Discharge Summary
Recommend CT chest with contrast for further evaluation Read By - Alla Germaniana Edgar M.D.  Electronically Verified By - Alla Germaniana Edgar M.D.  Released Date Time - 02/20/2016 10:43 AM  Resident -     Per Radiology:  Xr Pelvis 1 Or 2 Views    Result Date: 02/19/2016  Impression: No acute osseous abnormality. I personally reviewed the images and the residents findings and agree with the above. Read By Valentino Nose- Jamie Ledford M.D.  Electronically Verified By Valentino Nose- Jamie Ledford M.D.  Released Date Time - 02/19/2016 4:57 AM  Resident - Tamala SerMario Agrait Bertran      Disposition:  home    Treatments: IV hydration, analgesia: tramadol, cardiac meds: metoprolol, respiratory therapy: O2 and therapies: Physical Therapy and Occupational Therapy       Discharge Medications      Active Meds       Sig    levETIRAcetam 500 MG Tabs   Commonly known as:  KEPPRA    500 mg, Oral, Q12H   Quantity:  4 tablet       metoprolol tartrate 50 MG Tabs   Commonly known as:  LOPRESSOR    50 mg, Oral, BID   Quantity:  60 tablet       traMADol 50 MG Tabs   Commonly known as:  ULTRAM    50 mg, Oral, Q8H PRN   Quantity:  30 tablet         Continued Prior to Admission Meds       Sig    simvastatin 20 MG Tabs   Commonly known as:  ZOCOR    20 mg, Oral, Nightly         Stopped Meds          apixaban 5 MG Tabs   Commonly known as:  ELIQUIS       aspirin 81 MG Tabs             Discharge Orders  Diet Cardiac (Fat, Chol, Sod, Restricted)     For patients with heart or kidney problems: Measure weight daily   If I have heart or kidney problems, I will record my daily weight and call my physician to report a gain of 3 lbs or more in 5 days.       Activity as tolerated     Symptoms to monitor after discharge   For any questions or concerns and to report any of the following symptoms/problems: Worsening of current symptoms, Chest pain, Shortness of breath, Dizziness, Swelling in ankles or legs, Pain uncontrolled by medication, Drainage or foul odor from incision, Extensive bruising or

## 2016-02-24 NOTE — Discharge Summary
discoloration, Nausea/vomiting, Elevated temperature. Notify your physician.     WARFARIN (Coumadin) Education is NOT Required     The orders/medications listed here were reviewed, ordered and electronically signed by:   [place] S Galician, MD            [place] S Galician  02/23/2016 12:39 PM

## 2016-02-24 NOTE — Discharge Summary
cleared by PT/OT for home with interm,ittent supervision on 2/19, and the patient was discharged home on 02/23/16.   ?    Discharged Condition: stable    Consults:   Consults   Procedures   ? Inpatient Consult to Surgical Critical Care   ? Inpatient Consult to Neurosurgery       Significant Diagnostic Studies: labs: CBC, BMP, MG, PHOS, TEG, PTT, Glucose  Per Radiology:  Ct Head W/o Con    Result Date: 02/20/2016  1. Mild interval increase in size of the right frontal interhemispheric falx subdural hematoma and right frontal intraparenchymal hemorrhagic contusion with surrounding vasogenic edema. 2. Stable appearance of right hemispheric convexity subdural hematoma and scattered areas of subarachnoid hemorrhage. Read By Christianne Dolin- Dinesh Rao  Electronically Verified By - Christianne Dolininesh Rao  Released Date Time - 02/20/2016 9:08 AM  Resident -     Per Radiology:  Ct Head W/o Con    Result Date: 02/19/2016  Right hemispheric extra-axial hemorrhage measuring measuring up to 6 and 5 mm from the inner table overlying the right frontal and parietal convexities respectively. Scattered supratentorial subarachnoid hemorrhage as above. No midline shift. Large left temporoparietal occipital subgaleal hematoma without underlying osseous abnormality. Sharon SellerLloyd  was notified of the above findings by telephone on 02/19/2016 at approximately 0939 hours.  This individual acknowledged receipt of the information and repeated it back. I personally reviewed the images and the residents findings and agree with the above. Read By Ron Agee- Sukhwinder Johnny Singh Sandhu M.D.  Electronically Verified By Ron Agee- Sukhwinder Johnny Singh Sandhu M.D.  Released Date Time - 02/19/2016 2:25 PM  Resident - Barrie LymeFranz Toro-Pape    Per Radiology:  Xr Chest Single View    Result Date: 02/20/2016  Impression: Widening of the superior mediastinum with deviation of the trachea to the right could be related to a thyroid or mediastinal mass.

## 2016-02-26 NOTE — Consults
Eosinophils % 0.0 (L) 1.0 - 4.0 %    Immature Granulocytes % 0.3 0.0 - 2.0 %    Neutrophils Absolute 10.46 (H) 1.80 - 8.70 x10E3/uL    Lymphocytes Absolute 0.54 x10E3/uL    Monocytes Absolute 0.62 x10E3/uL    Eosinophils Absolute 0.00 x10E3/uL    Basophil Absolute 0.01 x10E3/uL    Basophils % 0.1 0 - 1 %   Type and Screen    Collection Time: 02/19/16 12:12 AM   Result Value Ref Range    ABO Grouping AB     Rh Type Positive      [facility]     Assessment & Recommendations:    ASSESSMENT  Patient Active Problem List   Diagnosis   ? Syncope   ? Subarachnoid hematoma   ? On anticoagulant therapy   ? Chronic atrial fibrillation       PLAN    Admit to SICU for BIG 3 SAH  TEG/Platelet mapping given anti-coagulation  Repeat CT head 0400 (6 hours from initial CT head)  NSG consult to evaluate in AM or if decline in GCS  TTE for syncope  PRN analgesia  Q1H neurochecks, elevate HOB  Telemetry for a-fib, c/w home metoprolol and zocor  Regular diet, zofran for nausea prn  SCDs, hold chemical prophy  Cog eval in AM    Full Code    SCOTT SYLVESTER  02/19/2016 12:46 AM

## 2016-02-26 NOTE — Consults
? [  START ON 02/20/2016] metoprolol tartrate  50 mg Oral BID   ? [START ON 02/20/2016] pravastatin  10 mg Oral Nightly     Continuous Infusions:   PRN:    Allergies:  Review of patient's allergies indicates no known allergies.    Review of Systems:  Constitutional: Negative  Eyes: Negative  Ears, nose, mouth, throat, and face: Negative  Respiratory: positive for URI  Cardiovascular: positive for irregular heart beats  Gastrointestinal: Negative  Genitourinary:Negative  Integument/breast: Negative  Hematologic/lymphatic: Negative  Musculoskeletal:Negative  Neurological: Negative  Behavioral/Psych: Negative  Endocrine: positive for hyperthyroid    Objective:     Ht Readings from Last 1 Encounters:   No data found for Ht      Wt Readings from Last 1 Encounters:   No data found for Wt    There is no height or weight on file to calculate BMI.  Patient Vitals for the past 8 hrs:   BP Temp Temp src Pulse Resp SpO2   02/19/16 0015 115/61 - - 96 24 94 %   02/19/16 0010 115/73 - - 112 21 95 %   02/19/16 0005 110/67 - - 118 25 94 %   02/19/16 0000 106/70 - - 99 25 93 %   02/18/16 2355 118/68 37.6 ?C (99.6 ?F) Oral 104 26 94 %        Physical Exam:  BP 115/61 - Pulse 96 - Temp 37.6 ?C (99.6 ?F) (Oral)  - Resp 24 - SpO2 94%  General appearance: alert, cooperative, no distress, appears stated age, moderately obese  Head: Normocephalic, without obvious abnormality  Eyes: conjunctivae/corneas clear. PERRL, EOM's intact. Fundi benign  Ears: normal TM's and external ear canals AU  Nose: Nares normal. Septum midline. Mucosa normal. No drainage or sinus tenderness.  Throat: normal mucosa, tongue, lips no sign of trauma  Neck: enlarged, stout neck, tender to palpation across R lateral neck, no thrill or bruits  Lungs: clear to auscultation bilaterally  Heart: irregurlarly irregular rate and rhythm rate and rhythm, S1, S2 normal, no murmur, click, rub or gallop  Abdomen: soft, non-tender; bowel sounds normal; no masses, no organomegaly

## 2016-02-26 NOTE — Consults
Department of Surgery  CONSULTATION NOTE                                Date of Consult: 02/19/2016  Reason for request: SAH on Anti-coagulation     Alexander Padilla is a 76 y.o. male who is being evaluated for Subarachnoid hematoma.  This consultation was requested by Dr. Lenise Heraldimachk of the Trauma service.   Subjective:    History of present illness:    [facility] Pad[facility] Reports that earlier this evening he felt unwell while preparing dinner, then wife found him on the floor. [facility] work[facility] revealed SAH. He currently c/o R neck pain, no other complaints. He has a history of A-fib and CAD s/p PCI, he currently takes ASA, metoprolol and eliquis, last took all this morning. Also reports Hyperthyroidism s/p RAI and scheduled for f/u w/ PCP this month. Denies dizziness/lightheadedness, weakness, numbness/tingling, difficulty urinating.     Past Medical History:   Diagnosis Date   ? Atrial fibrillation    ? CAD (coronary artery disease)     s/p pci   ? HTN (hypertension)    ? Hyperthyroidism     s/p RAI      Past Surgical History:   Procedure Laterality Date   ? TOTAL HIP ARTHROPLASTY Bilateral      Family History   Problem Relation Age of Onset   ? No Known Problems Mother    ? No Known Problems Father      Social History     Social History   ? Marital status: Married     Spouse name: N/A   ? Number of children: N/A   ? Years of education: N/A     Occupational History   ? Not on file.     Social History Main Topics   ? Smoking status: Never Smoker   ? Smokeless tobacco: Never Used   ? Alcohol use Yes   ? Drug use: No   ? Sexual activity: Not on file     Other Topics Concern   ? Not on file     Social History Narrative   ? No narrative on file     I have updated and/or confirmed the past medical, surgical, family and social history.    Home Medications:    ASA  Eliquis  Metoprolol  Statin    Current Hospital Medications:  Scheduled:

## 2016-02-26 NOTE — Consults
Eosinophils % 0.0 (L) 1.0 - 4.0 %    Immature Granulocytes % 0.3 0.0 - 2.0 %    Neutrophils Absolute 10.46 (H) 1.80 - 8.70 x10E3/uL    Lymphocytes Absolute 0.54 x10E3/uL    Monocytes Absolute 0.62 x10E3/uL    Eosinophils Absolute 0.00 x10E3/uL    Basophil Absolute 0.01 x10E3/uL    Basophils % 0.1 0 - 1 %   Type and Screen    Collection Time: 02/19/16 12:12 AM   Result Value Ref Range    ABO Grouping AB     Rh Type Positive      [facility]     Assessment & Recommendations:    ASSESSMENT  Patient Active Problem List   Diagnosis   ? Syncope   ? Subarachnoid hematoma   ? On anticoagulant therapy   ? Chronic atrial fibrillation       PLAN    Admit to SICU for BIG 3 SAH  TEG/Platelet mapping given anti-coagulation  Repeat CT head 0400 (6 hours from initial CT head)  NSG consult to evaluate in AM or if decline in GCS  PRN analgesia  Q1H neurochecks, elevate HOB  Telemetry for a-fib, c/w home metoprolol and zocor  Regular diet, zofran for nausea prn  SCDs, hold chemical prophy  Cog eval in AM    Full Code    SCOTT SYLVESTER  02/19/2016 12:46 AM

## 2016-02-26 NOTE — Consults
Eosinophils % 0.0 (L) 1.0 - 4.0 %    Immature Granulocytes % 0.3 0.0 - 2.0 %    Neutrophils Absolute 10.46 (H) 1.80 - 8.70 x10E3/uL    Lymphocytes Absolute 0.54 x10E3/uL    Monocytes Absolute 0.62 x10E3/uL    Eosinophils Absolute 0.00 x10E3/uL    Basophil Absolute 0.01 x10E3/uL    Basophils % 0.1 0 - 1 %   Type and Screen    Collection Time: 02/19/16 12:12 AM   Result Value Ref Range    ABO Grouping AB     Rh Type Positive      [facility]     Assessment & Recommendations:    ASSESSMENT  Patient Active Problem List   Diagnosis   ? Syncope   ? Subarachnoid hematoma   ? On anticoagulant therapy   ? Chronic atrial fibrillation       PLAN    Admit to SICU for BIG 3 SAH  TEG/Platelet mapping given anti-coagulation  Repeat CT head 0400 (6 hours from initial CT head)  NSG consult to evaluate in AM or if decline in GCS  TTE for syncope  PRN analgesia  Q1H neurochecks, elevate HOB  Telemetry for a-fib, c/w home metoprolol and zocor  Regular diet, zofran for nausea prn  SCDs, hold chemical prophy  Cog eval in AM    Full Code    SCOTT SYLVESTER  02/19/2016 12:46 AM     ATTENDING NOTE    I have seen and evaluated the patient.  I have discussed with the resident and agree with resident?s findings and plan as documented in the resident?s note.     Cathlean MarseillesJoseph R Shiber, MD  02/26/2016  12:34 PM

## 2016-02-26 NOTE — Consults
Male genitalia:no signs of trauma no blood at meatus  Extremities: extremities normal, atraumatic, no cyanosis or edema  Pulses: 2+ and symmetric  Skin: Skin color, texture, turgor normal. No rashes or lesions  Neurologic: Alert and oriented X 3, normal strength and tone. Sensory intact throughout. No CN deficits appreciaed    Data Review:  Recent Results (from the past 24 hour(s))   aPTT    Collection Time: 02/18/16 11:55 PM   Result Value Ref Range    PTT 32 24 - 35 seconds   PROTIME-INR    Collection Time: 02/18/16 11:55 PM   Result Value Ref Range    Protime 15.9 (H) 11.9 - 14.3 seconds    INR 1.3 (H) 0.9 - 1.1   Magnesium    Collection Time: 02/18/16 11:55 PM   Result Value Ref Range    Magnesium 1.9 1.8 - 2.6 mg/dL   Phosphorus    Collection Time: 02/18/16 11:55 PM   Result Value Ref Range    Phosphorus,Inorganic 3.1 2.5 - 4.5 mg/dL   Basic Metabolic Panel    Collection Time: 02/18/16 11:55 PM   Result Value Ref Range    Sodium 137 135 - 145 mmol/L    Potassium 4.2 3.3 - 4.6 mmol/L    Chloride 100 (L) 101 - 110 mmol/L    CO2 24 21 - 29 mmol/L    Urea Nitrogen 18 6 - 22 mg/dL    Creatinine 0.82 0.67 - 1.17 mg/dL    BUN/Creatinine Ratio 22.0 6.0 - 22.0 (calc)    Glucose 135 (H) 71 - 99 mg/dL    Calcium 8.4 (L) 8.6 - 10.0 mg/dL    Osmolality Calc 277.7     Anion Gap 13 4 - 16 mmol/L    EGFR >59 mL/min/1.73M2   CBC with Differential panel result    Collection Time: 02/18/16 11:55 PM   Result Value Ref Range    WBC 11.66 (H) 4.5 - 11 x10E3/uL    RBC 4.18 (L) 4.50 - 6.30 x10E6/uL    Hemoglobin 12.4 (L) 14.0 - 18.0 g/dL    Hematocrit 36.6 (L) 40.0 - 54.0 %    MCV 87.6 82.0 - 101.0 fl    MCH 29.7 27.0 - 34.0 pg    MCHC 33.9 31.0 - 36.0 g/dL    RDW 12.6 12.0 - 16.1 %    Platelet Count 143 140 - 440 thou/cu mm    MPV 10.3 9.5 - 11.5 fl    nRBC % 0.0 0.0 - 1.0 %    Absolute NRBC Count 0.00     Neutrophils % 89.7 (H) 34.0 - 73.0 %    Lymphocytes % 4.6 (L) 25.0 - 45.0 %    Monocytes % 5.3 2.0 - 6.0 %

## 2016-04-01 DIAGNOSIS — S065X9A Traumatic subdural hemorrhage with loss of consciousness of unspecified duration, initial encounter: Principal | ICD-10-CM

## 2016-04-12 ENCOUNTER — Encounter: Attending: Neurological Surgery

## 2016-07-27 ENCOUNTER — Other Ambulatory Visit: Payer: Self-pay | Admitting: Internal Medicine

## 2016-07-27 DIAGNOSIS — E0520 Thyrotoxicosis with toxic multinodular goiter without thyrotoxic crisis or storm: Secondary | ICD-10-CM

## 2016-08-03 ENCOUNTER — Other Ambulatory Visit: Payer: Self-pay | Admitting: Internal Medicine

## 2016-08-03 DIAGNOSIS — E0520 Thyrotoxicosis with toxic multinodular goiter without thyrotoxic crisis or storm: Secondary | ICD-10-CM

## 2016-08-18 ENCOUNTER — Other Ambulatory Visit: Payer: Medicare Other

## 2016-08-19 ENCOUNTER — Other Ambulatory Visit: Payer: Medicare Other

## 2016-09-23 ENCOUNTER — Encounter
Admission: RE | Admit: 2016-09-23 | Discharge: 2016-09-23 | Disposition: A | Discharge status: Home / Self Care | Payer: Medicare Other | Source: Ambulatory Visit | Attending: Internal Medicine | Admitting: Internal Medicine

## 2016-09-23 DIAGNOSIS — E0520 Thyrotoxicosis with toxic multinodular goiter without thyrotoxic crisis or storm: Secondary | ICD-10-CM | POA: Diagnosis not present

## 2016-09-23 MED ORDER — SODIUM IODIDE I-123 7.4 MBQ CAPS
140.3000 | ORAL_CAPSULE | Freq: Once | ORAL | Status: AC
  Administered 2016-09-23: 140.3 via ORAL

## 2016-09-24 ENCOUNTER — Encounter
Admission: RE | Admit: 2016-09-24 | Discharge: 2016-09-24 | Disposition: A | Discharge status: Home / Self Care | Payer: Medicare Other | Source: Ambulatory Visit | Attending: Internal Medicine | Admitting: Internal Medicine

## 2016-09-24 DIAGNOSIS — E0520 Thyrotoxicosis with toxic multinodular goiter without thyrotoxic crisis or storm: Secondary | ICD-10-CM

## 2016-10-07 ENCOUNTER — Encounter
Admission: RE | Admit: 2016-10-07 | Discharge: 2016-10-07 | Disposition: A | Discharge status: Home / Self Care | Payer: Medicare Other | Source: Ambulatory Visit | Attending: Internal Medicine | Admitting: Internal Medicine

## 2016-10-07 DIAGNOSIS — E0520 Thyrotoxicosis with toxic multinodular goiter without thyrotoxic crisis or storm: Secondary | ICD-10-CM | POA: Diagnosis present

## 2016-10-07 DIAGNOSIS — E0590 Thyrotoxicosis, unspecified without thyrotoxic crisis or storm: Secondary | ICD-10-CM

## 2016-10-07 HISTORY — DX: Thyrotoxicosis, unspecified without thyrotoxic crisis or storm: E05.90

## 2016-10-07 MED ORDER — SODIUM IODIDE I 131 CAPSULE
53.0680 | Freq: Once | INTRAVENOUS | Status: AC | PRN
  Administered 2016-10-07: 53.068 via ORAL

## 2016-12-22 IMAGING — MR MR HIP*R* W/CM
4 of 5 series · 21 of 40 positions shown · non-contrast
Comparison: Bone scan of 08/14/2008

CLINICAL DATA: Right hip pain and limited range of motion for 1
year.

EXAM:
MRI OF THE RIGHT HIP WITH INTRA-ARTICULAR CONTRAST
TECHNIQUE: Multiplanar, multisequence MR imaging was performed following the
administration of intra-articular contrast.
:

[Series 3: T1 fat-sat · axial · 4.0mm · 0.39mm/px · z∈[-81,+24]mm · 7 of 30 slices shown (1 of 3)]
[im 1/30]
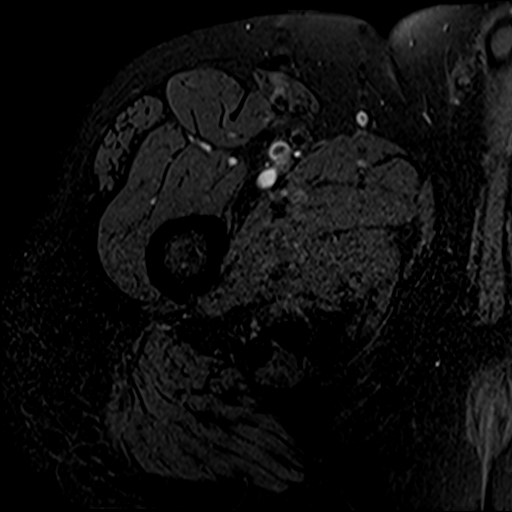
[im 5/30]
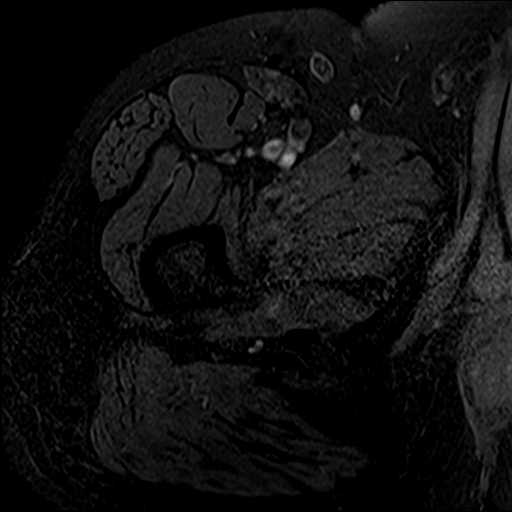
[im 9/30]
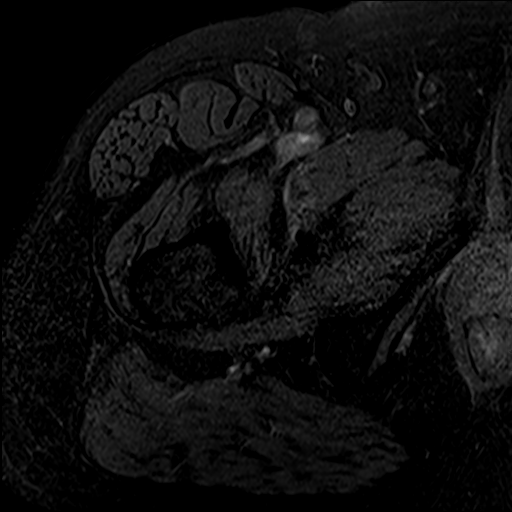
[im 13/30]
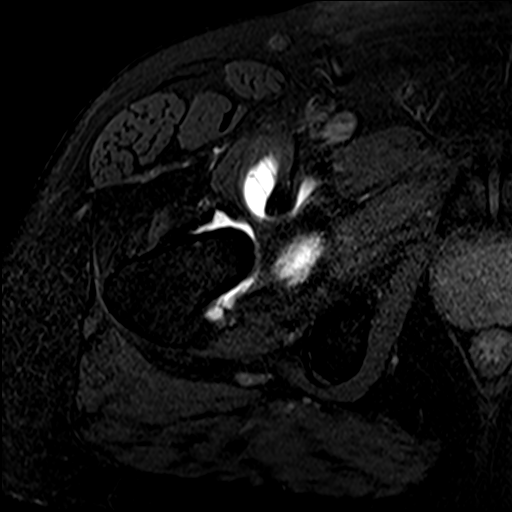
[im 17/30]
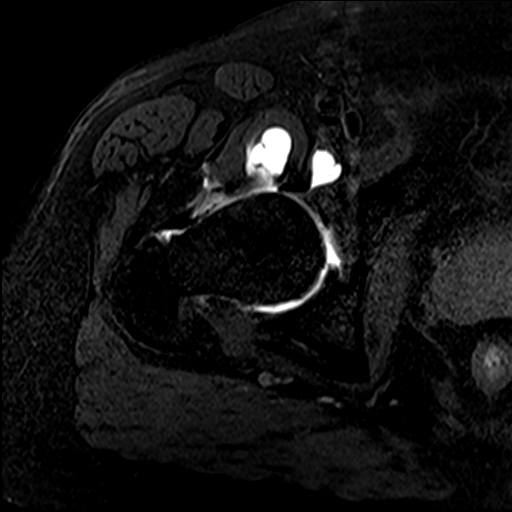
[im 21/30]
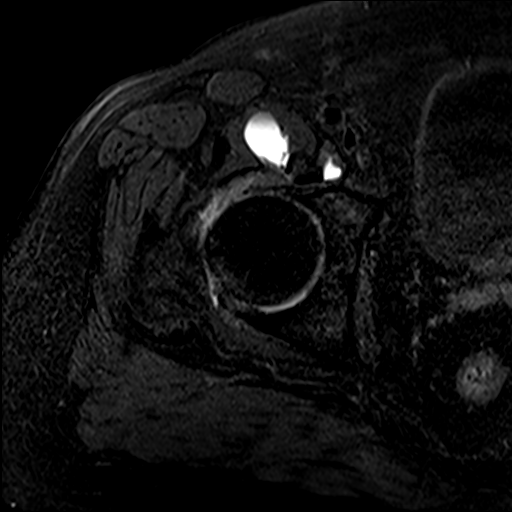
[im 25/30]
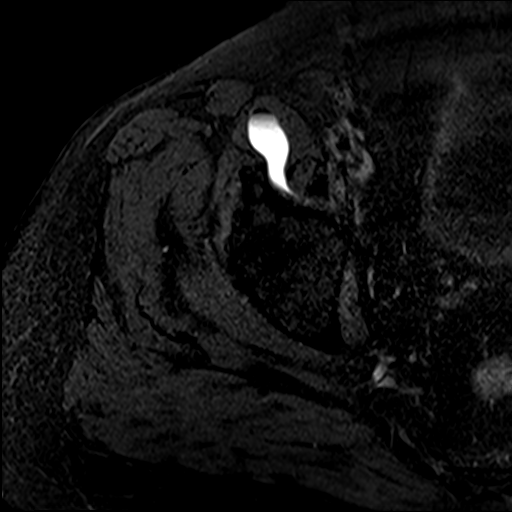

[Series 4: T1 fat-sat · coronal · 4.0mm · 0.39mm/px · 3 of 25 slices shown (2 of 3)]
[im 5/25]
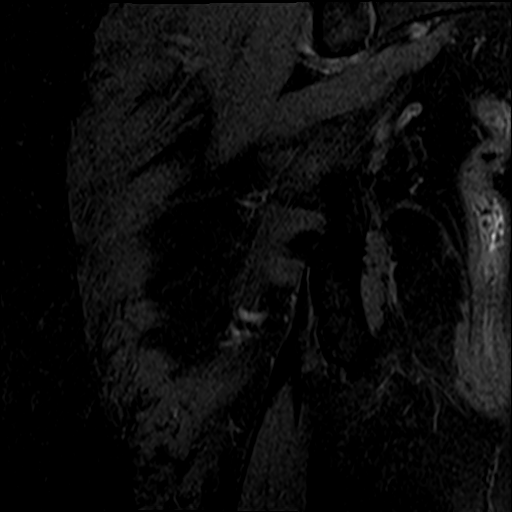
[im 13/25]
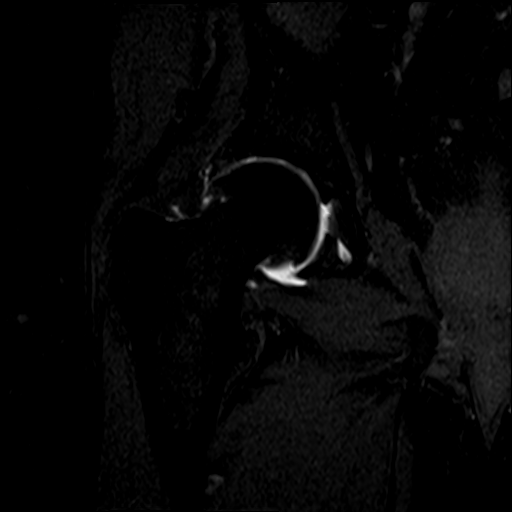
[im 21/25]
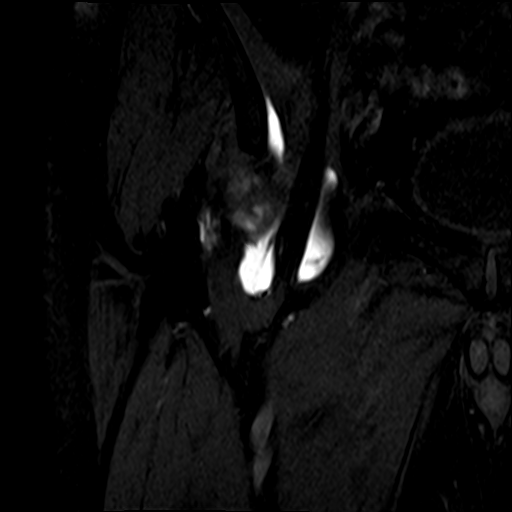

[Series 5: T1 fat-sat · sagittal · 4.0mm · 0.78mm/px · 3 of 30 slices shown (3 of 3)]
[im 4/30]
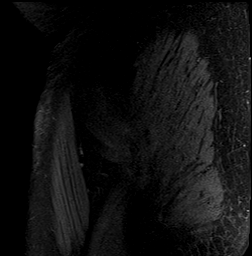
[im 15/30]
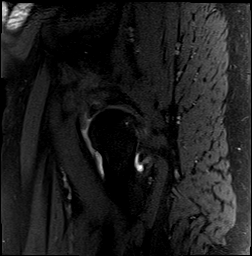
[im 26/30]
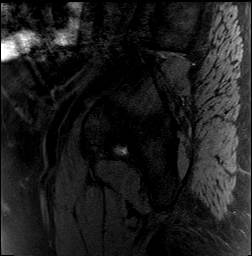

[Series 7: T2 fat-sat · coronal · 5.0mm · 0.78mm/px · 8 of 28 slices shown]
[im 1/28]
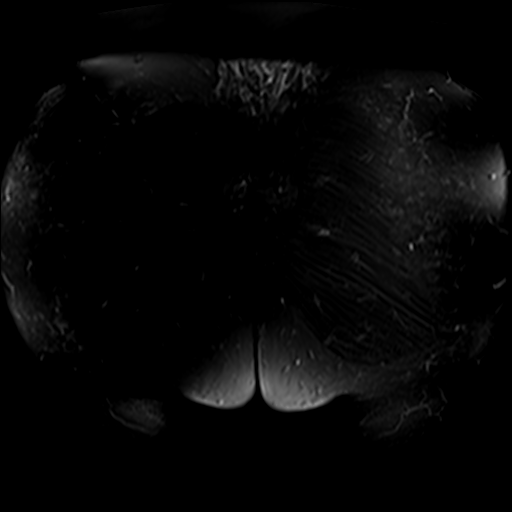
[im 4/28]
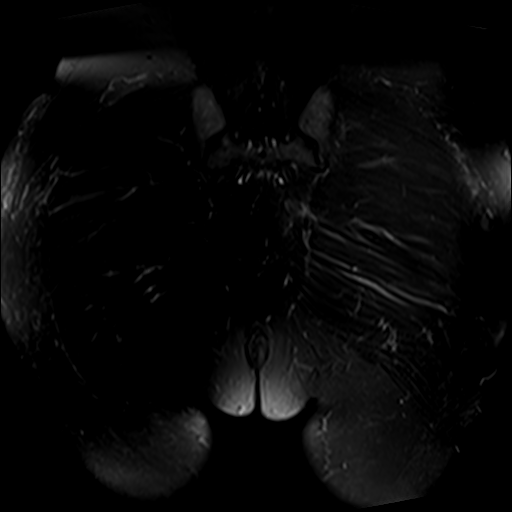
[im 8/28]
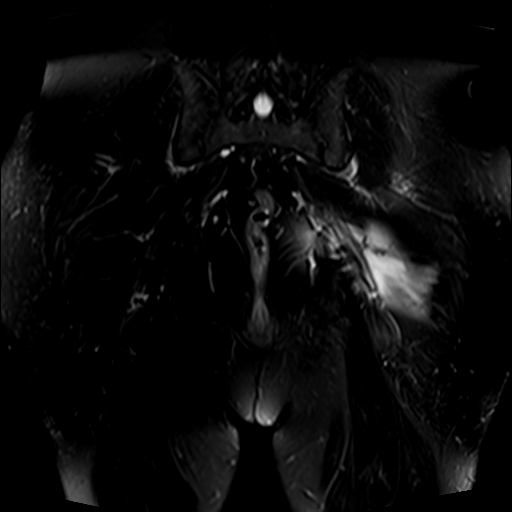
[im 12/28]
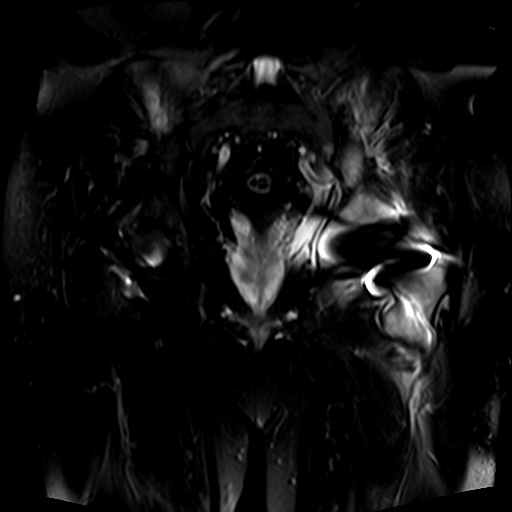
[im 16/28]
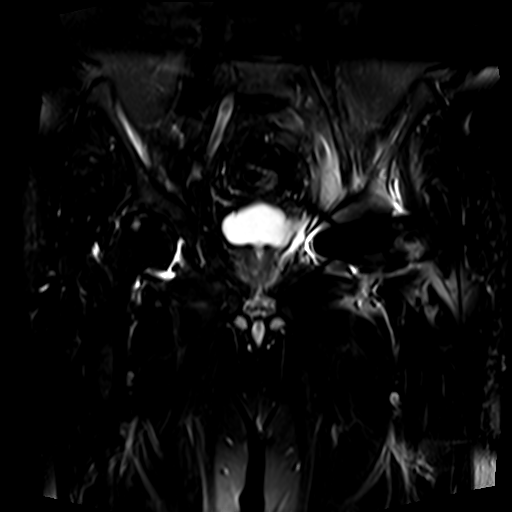
[im 20/28]
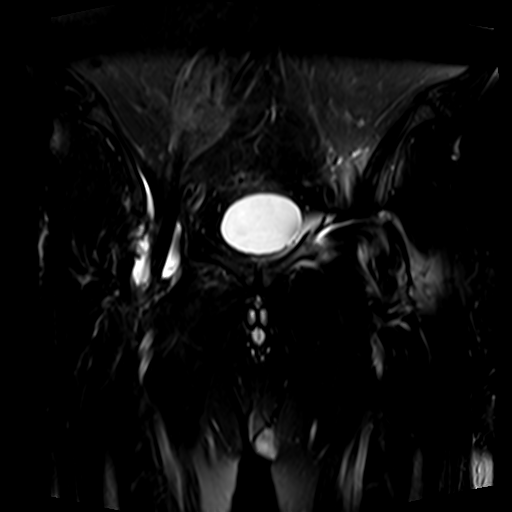
[im 24/28]
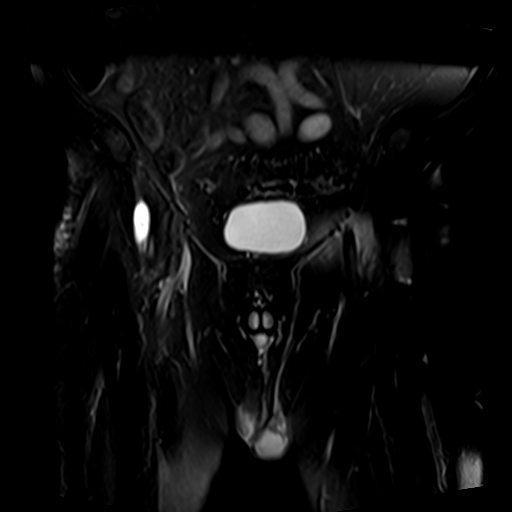
[im 28/28]
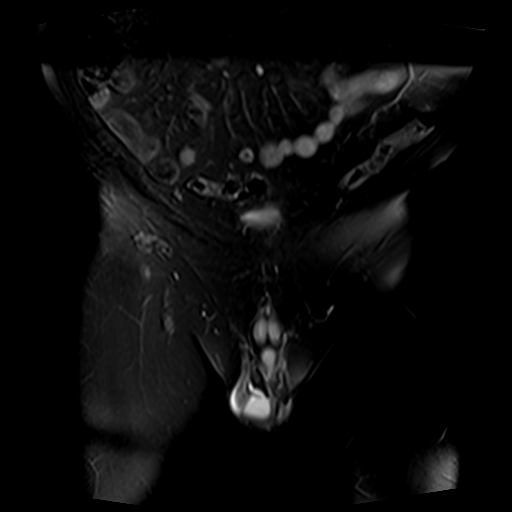

[21 of 40 positions shown; findings below may reference images not displayed]

FINDINGS: Bones: Metal artifact from left hip prosthesis. Spurring of the
right femoral head noted with degenerative focus of subcortical
edema on image 16 series 7. Mild degenerative flattening of the
right femoral head and spurring of the acetabulum.

Articular cartilage and labrum

Articular cartilage: Severe and nearly full-thickness articular
cartilage thinning, somewhat irregular.

Labrum: Prominent anterior and anterior superior labral degeneration
with suspected mild synovitis adjacent to the anterior labrum, but
without a well-defined collection of contrast extending through the
labrum. The labral contrast extension is questionable on image 19 of
series 4 but not certain.

Joint or bursal effusion

Bursae: Bilobed iliopsoas bursa extending both cephalad and caudad
to the joint, and connecting to the joint, with internal contrast
medium. No significant abnormal fluid collection around the
contralateral (left) hip.

Muscles and tendons

Muscles and tendons:  Unremarkable

Other findings

Miscellaneous:  Sigmoid colon diverticulosis.
IMPRESSION: 1. Severe degenerative arthropathy of the right hip. Prominent
degeneration of the anterior superior labrum but without a
well-defined labral tear.
2. Iliopsoas bursitis with a bilobed and somewhat distended
iliopsoas bursa extending cephalad and caudad to the hip joint, and
communicating with the joint.
3. Sigmoid colon diverticulosis.

## 2017-11-01 ENCOUNTER — Other Ambulatory Visit: Payer: Self-pay | Admitting: Podiatry

## 2017-11-01 ENCOUNTER — Other Ambulatory Visit: Payer: Self-pay

## 2017-11-01 ENCOUNTER — Encounter
Admission: RE | Admit: 2017-11-01 | Discharge: 2017-11-01 | Disposition: A | Discharge status: Home / Self Care | Payer: Medicare Other | Source: Ambulatory Visit | Attending: Podiatry | Admitting: Podiatry

## 2017-11-01 DIAGNOSIS — Z01812 Encounter for preprocedural laboratory examination: Secondary | ICD-10-CM | POA: Diagnosis not present

## 2017-11-01 HISTORY — DX: Cardiac arrhythmia, unspecified: I49.9

## 2017-11-01 HISTORY — DX: Traumatic subarachnoid hemorrhage with loss of consciousness of unspecified duration, initial encounter: S06.6X9A

## 2017-11-01 HISTORY — DX: Cardiomyopathy, unspecified: I42.9

## 2017-11-01 HISTORY — DX: Essential (primary) hypertension: I10

## 2017-11-01 HISTORY — DX: Malignant (primary) neoplasm, unspecified: C80.1

## 2017-11-01 HISTORY — DX: Nontoxic single thyroid nodule: E04.1

## 2017-11-01 HISTORY — DX: Atherosclerotic heart disease of native coronary artery without angina pectoris: I25.10

## 2017-11-01 LAB — CBC
HEMATOCRIT: 42.6 % (ref 39.0–52.0)
HEMOGLOBIN: 14.1 g/dL (ref 13.0–17.0)
MCH: 31.5 pg (ref 26.0–34.0)
MCHC: 33.1 g/dL (ref 30.0–36.0)
MCV: 95.1 fL (ref 80.0–100.0)
Platelets: 156 10*3/uL (ref 150–400)
RBC: 4.48 MIL/uL (ref 4.22–5.81)
RDW: 12.7 % (ref 11.5–15.5)
WBC: 6.8 10*3/uL (ref 4.0–10.5)
nRBC: 0 % (ref 0.0–0.2)

## 2017-11-01 LAB — BASIC METABOLIC PANEL
Anion gap: 9 (ref 5–15)
BUN: 21 mg/dL (ref 8–23)
CALCIUM: 9.1 mg/dL (ref 8.9–10.3)
CHLORIDE: 104 mmol/L (ref 98–111)
CO2: 29 mmol/L (ref 22–32)
CREATININE: 0.87 mg/dL (ref 0.61–1.24)
GFR calc non Af Amer: >60 mL/min (ref >60)
Glucose, Bld: 97 mg/dL (ref 70–99)
Potassium: 4.8 mmol/L (ref 3.5–5.1)
SODIUM: 142 mmol/L (ref 135–145)

## 2017-11-01 NOTE — Pre-Procedure Instructions (Signed)
Value Ref Range  Vent Rate (bpm) 77   QRS Interval (msec) 76   QT Interval (msec) 368   QTc (msec) 416   Other Result Information  This result has an attachment that is not available.  Result Narrative  Atrial fibrillation Anterior infarct , age undetermined Abnormal ECG No previous ECGs available I reviewed and concur with this report. Electronically signed [email or web address], MD, Cristie Hem ([id number]) on 10/27/2017 10:24:11 AM  Status Results Details

## 2017-11-01 NOTE — Patient Instructions (Signed)
Your procedure is scheduled on: 11/11/17 Report to Day Surgery. [facility]OND FLOOR To find out your arrival time please call [phone] between 1PM - 3PM on 11/10/17  Remember: Instructions that are not followed completely may result in serious medical risk,  up to and including death, or upon the discretion of your surgeon and anesthesiologist your  surgery may need to be rescheduled.     _X__ 1. Do not eat food after midnight the night before your procedure.                 No gum chewing or hard candies. You may drink clear liquids up to 2 hours                 before you are scheduled to arrive for your surgery- DO not drink clear                 liquids within 2 hours of the start of your surgery.                 Clear Liquids include:  water, apple juice without pulp, clear carbohydrate                 drink such as Clearfast of Gatorade, Black Coffee or Tea (Do not add                 anything to coffee or tea).  __X__2.  On the morning of surgery brush your teeth with toothpaste and water, you                may rinse your mouth with mouthwash if you wish.  Do not swallow any toothpaste of mouthwash.     _X__ 3.  No Alcohol for 24 hours before or after surgery.   _X__ 4.  Do Not Smoke or use e-cigarettes For 24 Hours Prior to Your Surgery.                 Do not use any chewable tobacco products for at least 6 hours prior to                 surgery.  ____  5.  Bring all medications with you on the day of surgery if instructed.   ___X_  6.  Notify your doctor if there is any change in your medical condition      (cold, fever, infections).     Do not wear jewelry, make-up, hairpins, clips or nail polish. Do not wear lotions, powders, or perfumes. You may wear deodorant. Do not shave 48 hours prior to surgery. Men may shave face and neck. Do not bring valuables to the hospital.    [facility] is not responsible for any belongings or  valuables.  Contacts, dentures or bridgework may not be worn into surgery. Leave your suitcase in the car. After surgery it may be brought to your room. For patients admitted to the hospital, discharge time is determined by your treatment team.   Patients discharged the day of surgery will not be allowed to drive home.   Please read over the following fact sheets that you were given:   Surgical Site Infection Prevention / SPIROMETRY   ___X_ Take these medicines the morning of surgery with A SIP OF WATER:    1. CARVEDILOL  2.   3.   4.  5.  6.  ____ Fleet Enema (as directed)   _X___ Use CHG Soap as  directed  ____ Use inhalers on the day of surgery  ____ Stop metformin 2 days prior to surgery    ____ Take 1/2 of usual insulin dose the night before surgery. No insulin the morning          of surgery.   _X___ Stop Coumadin/Plavix/aspirin on   STOP ELIQUIS 3 DAYS BEFORE SURGERY AND ASPIRIN 5 DAYS BEFORE SURGERY AS INSTRUCTED BY CARDIOLOGIST  ____ Stop Anti-inflammatories on    ____ Stop supplements until after surgery.    ____ Bring C-Pap to the hospital.

## 2017-11-10 MED ORDER — CEFAZOLIN SODIUM-DEXTROSE 2-4 GM/100ML-% IV SOLN
2.0000 g | INTRAVENOUS | Status: AC
  Administered 2017-11-11: 2 g via INTRAVENOUS

## 2017-11-11 ENCOUNTER — Encounter
Admission: RE | Disposition: A | Discharge status: Home / Self Care | Payer: Self-pay | Source: Ambulatory Visit | Attending: Podiatry

## 2017-11-11 ENCOUNTER — Other Ambulatory Visit: Payer: Self-pay

## 2017-11-11 ENCOUNTER — Ambulatory Visit
Admission: RE | Admit: 2017-11-11 | Discharge: 2017-11-11 | Disposition: A | Discharge status: Home / Self Care | Payer: Medicare Other | Source: Ambulatory Visit | Attending: Podiatry | Admitting: Podiatry

## 2017-11-11 ENCOUNTER — Ambulatory Visit: Payer: Medicare Other | Admitting: Anesthesiology

## 2017-11-11 ENCOUNTER — Encounter: Payer: Self-pay | Admitting: *Deleted

## 2017-11-11 DIAGNOSIS — M1A9XX1 Chronic gout, unspecified, with tophus (tophi): Secondary | ICD-10-CM | POA: Insufficient documentation

## 2017-11-11 DIAGNOSIS — Z85828 Personal history of other malignant neoplasm of skin: Secondary | ICD-10-CM | POA: Diagnosis not present

## 2017-11-11 DIAGNOSIS — M2022 Hallux rigidus, left foot: Secondary | ICD-10-CM | POA: Diagnosis not present

## 2017-11-11 DIAGNOSIS — I2510 Atherosclerotic heart disease of native coronary artery without angina pectoris: Secondary | ICD-10-CM | POA: Insufficient documentation

## 2017-11-11 DIAGNOSIS — I429 Cardiomyopathy, unspecified: Secondary | ICD-10-CM | POA: Insufficient documentation

## 2017-11-11 DIAGNOSIS — Z7901 Long term (current) use of anticoagulants: Secondary | ICD-10-CM | POA: Diagnosis not present

## 2017-11-11 DIAGNOSIS — Z87891 Personal history of nicotine dependence: Secondary | ICD-10-CM | POA: Diagnosis not present

## 2017-11-11 DIAGNOSIS — B070 Plantar wart: Secondary | ICD-10-CM | POA: Insufficient documentation

## 2017-11-11 DIAGNOSIS — M205X2 Other deformities of toe(s) (acquired), left foot: Secondary | ICD-10-CM | POA: Insufficient documentation

## 2017-11-11 DIAGNOSIS — I10 Essential (primary) hypertension: Secondary | ICD-10-CM | POA: Insufficient documentation

## 2017-11-11 DIAGNOSIS — I4891 Unspecified atrial fibrillation: Secondary | ICD-10-CM | POA: Diagnosis not present

## 2017-11-11 HISTORY — PX: CHEILECTOMY: SHX1336

## 2017-11-11 SURGERY — CHEILECTOMY
Anesthesia: General | Site: Foot | Laterality: Left

## 2017-11-11 MED ORDER — MIDAZOLAM HCL 2 MG/2ML IJ SOLN
INTRAMUSCULAR | Status: AC
  Filled 2017-11-11: qty 2

## 2017-11-11 MED ORDER — FENTANYL CITRATE (PF) 100 MCG/2ML IJ SOLN
INTRAMUSCULAR | Status: AC
  Filled 2017-11-11: qty 2

## 2017-11-11 MED ORDER — LIDOCAINE HCL (PF) 2 % IJ SOLN
INTRAMUSCULAR | Status: AC
  Filled 2017-11-11: qty 40

## 2017-11-11 MED ORDER — FAMOTIDINE 20 MG PO TABS
20.0000 mg | ORAL_TABLET | Freq: Once | ORAL | Status: AC
  Administered 2017-11-11: 20 mg via ORAL

## 2017-11-11 MED ORDER — BUPIVACAINE HCL (PF) 0.25 % IJ SOLN
INTRAMUSCULAR | Status: DC | PRN
  Administered 2017-11-11 (×2): 10 mL

## 2017-11-11 MED ORDER — PHENYLEPHRINE HCL 10 MG/ML IJ SOLN
INTRAMUSCULAR | Status: DC | PRN
  Administered 2017-11-11: 200 ug via INTRAVENOUS

## 2017-11-11 MED ORDER — DEXAMETHASONE SODIUM PHOSPHATE 10 MG/ML IJ SOLN
INTRAMUSCULAR | Status: DC | PRN
  Administered 2017-11-11: 10 mg

## 2017-11-11 MED ORDER — SEVOFLURANE IN SOLN
RESPIRATORY_TRACT | Status: AC
  Filled 2017-11-11: qty 250

## 2017-11-11 MED ORDER — PROPOFOL 10 MG/ML IV BOLUS
INTRAVENOUS | Status: DC | PRN
  Administered 2017-11-11: 150 mg via INTRAVENOUS

## 2017-11-11 MED ORDER — BUPIVACAINE LIPOSOME 1.3 % IJ SUSP
INTRAMUSCULAR | Status: AC
  Filled 2017-11-11: qty 20

## 2017-11-11 MED ORDER — LIDOCAINE HCL (PF) 1 % IJ SOLN
INTRAMUSCULAR | Status: AC
  Filled 2017-11-11: qty 5

## 2017-11-11 MED ORDER — EPHEDRINE SULFATE 50 MG/ML IJ SOLN
INTRAMUSCULAR | Status: DC | PRN
  Administered 2017-11-11: 10 mg via INTRAVENOUS

## 2017-11-11 MED ORDER — FAMOTIDINE 20 MG PO TABS
ORAL_TABLET | ORAL | Status: AC
  Filled 2017-11-11: qty 1

## 2017-11-11 MED ORDER — LACTATED RINGERS IV SOLN
INTRAVENOUS | Status: DC
  Administered 2017-11-11: 06:00:00 via INTRAVENOUS

## 2017-11-11 MED ORDER — ONDANSETRON HCL 4 MG/2ML IJ SOLN
INTRAMUSCULAR | Status: DC | PRN
  Administered 2017-11-11: 4 mg via INTRAVENOUS

## 2017-11-11 MED ORDER — LIDOCAINE HCL (CARDIAC) PF 100 MG/5ML IV SOSY
PREFILLED_SYRINGE | INTRAVENOUS | Status: DC | PRN
  Administered 2017-11-11: 100 mg via INTRAVENOUS

## 2017-11-11 MED ORDER — FENTANYL CITRATE (PF) 100 MCG/2ML IJ SOLN: 25.0000 ug | INTRAMUSCULAR | Status: DC | PRN

## 2017-11-11 MED ORDER — HYDROCODONE-ACETAMINOPHEN 5-325 MG PO TABS: 1.0000 | ORAL_TABLET | Freq: Four times a day (QID) | ORAL | 0 refills | Status: DC | PRN

## 2017-11-11 MED ORDER — ONDANSETRON HCL 4 MG/2ML IJ SOLN: 4.0000 mg | Freq: Once | INTRAMUSCULAR | Status: DC | PRN

## 2017-11-11 MED ORDER — DEXAMETHASONE SODIUM PHOSPHATE 10 MG/ML IJ SOLN
INTRAMUSCULAR | Status: AC
  Filled 2017-11-11: qty 1

## 2017-11-11 MED ORDER — FENTANYL CITRATE (PF) 100 MCG/2ML IJ SOLN
INTRAMUSCULAR | Status: DC | PRN
  Administered 2017-11-11: 100 ug via INTRAVENOUS

## 2017-11-11 MED ORDER — CEFAZOLIN SODIUM-DEXTROSE 2-4 GM/100ML-% IV SOLN
INTRAVENOUS | Status: AC
  Filled 2017-11-11: qty 100

## 2017-11-11 MED ORDER — EPHEDRINE SULFATE 50 MG/ML IJ SOLN
INTRAMUSCULAR | Status: AC
  Filled 2017-11-11: qty 1

## 2017-11-11 MED ORDER — BUPIVACAINE HCL (PF) 0.25 % IJ SOLN
INTRAMUSCULAR | Status: AC
  Filled 2017-11-11: qty 30

## 2017-11-11 MED ORDER — BUPIVACAINE LIPOSOME 1.3 % IJ SUSP
INTRAMUSCULAR | Status: DC | PRN
  Administered 2017-11-11: 10 mL

## 2017-11-11 MED ORDER — BUPIVACAINE HCL (PF) 0.5 % IJ SOLN
INTRAMUSCULAR | Status: AC
  Filled 2017-11-11: qty 10

## 2017-11-11 MED ORDER — POVIDONE-IODINE 7.5 % EX SOLN
Freq: Once | CUTANEOUS | Status: DC
  Filled 2017-11-11: qty 118

## 2017-11-11 MED ORDER — PROPOFOL 10 MG/ML IV BOLUS
INTRAVENOUS | Status: AC
  Filled 2017-11-11: qty 60

## 2017-11-11 SURGICAL SUPPLY — 43 items
BANDAGE ELASTIC 4 LF NS (GAUZE/BANDAGES/DRESSINGS) ×2 IMPLANT
BLADE MED AGGRESSIVE (BLADE) ×2 IMPLANT
BLADE SURG 15 STRL LF DISP TIS (BLADE) ×1 IMPLANT
BLADE SURG 15 STRL SS (BLADE) ×1
BLADE SURG MINI STRL (BLADE) IMPLANT
BNDG CONFORM 3 STRL LF (GAUZE/BANDAGES/DRESSINGS) ×2 IMPLANT
BNDG ESMARK 4X12 TAN STRL LF (GAUZE/BANDAGES/DRESSINGS) ×2 IMPLANT
BNDG GAUZE 4.5X4.1 6PLY STRL (MISCELLANEOUS) ×2 IMPLANT
CANISTER SUCT 1200ML W/VALVE (MISCELLANEOUS) ×2 IMPLANT
COVER WAND RF STERILE (DRAPES) IMPLANT
CUFF DUAL TOURNIQUET 18IN DISP (TOURNIQUET CUFF) ×2 IMPLANT
CUFF TOURN 24 STER (MISCELLANEOUS) IMPLANT
DRAPE FLUOR MINI C-ARM 54X84 (DRAPES) ×2 IMPLANT
DURAPREP 26ML APPLICATOR (WOUND CARE) ×2 IMPLANT
ELECT REM PT RETURN 9FT ADLT (ELECTROSURGICAL) ×2
ELECTRODE REM PT RTRN 9FT ADLT (ELECTROSURGICAL) ×1 IMPLANT
GAUZE PETRO XEROFOAM 1X8 (MISCELLANEOUS) ×2 IMPLANT
GAUZE SPONGE 4X4 12PLY STRL (GAUZE/BANDAGES/DRESSINGS) ×2 IMPLANT
GAUZE STRETCH 2X75IN STRL (MISCELLANEOUS) ×2 IMPLANT
GLOVE BIO SURGEON STRL SZ7.5 (GLOVE) ×4 IMPLANT
GLOVE INDICATOR 8.0 STRL GRN (GLOVE) ×4 IMPLANT
GOWN STRL REUS W/ TWL LRG LVL3 (GOWN DISPOSABLE) ×2 IMPLANT
GOWN STRL REUS W/TWL LRG LVL3 (GOWN DISPOSABLE) ×2
KIT TURNOVER KIT A (KITS) ×2 IMPLANT
LABEL OR SOLS (LABEL) IMPLANT
NEEDLE FILTER BLUNT 18X 1/2SAF (NEEDLE) ×1
NEEDLE FILTER BLUNT 18X1 1/2 (NEEDLE) ×1 IMPLANT
NEEDLE HYPO 25X1 1.5 SAFETY (NEEDLE) ×4 IMPLANT
NS IRRIG 500ML POUR BTL (IV SOLUTION) ×6 IMPLANT
PACK EXTREMITY ARMC (MISCELLANEOUS) ×2 IMPLANT
PENCIL ELECTRO HAND CTR (MISCELLANEOUS) ×2 IMPLANT
RASP SM TEAR CROSS CUT (RASP) ×2 IMPLANT
STOCKINETTE IMPERVIOUS 9X36 MD (GAUZE/BANDAGES/DRESSINGS) ×2 IMPLANT
STOCKINETTE M/LG 89821 (MISCELLANEOUS) ×2 IMPLANT
STRIP CLOSURE SKIN 1/4X4 (GAUZE/BANDAGES/DRESSINGS) ×2 IMPLANT
SUT ETHILON 5-0 FS-2 18 BLK (SUTURE) ×2 IMPLANT
SUT MNCRL+ 5-0 UNDYED PC-3 (SUTURE) ×1 IMPLANT
SUT MONOCRYL 5-0 (SUTURE) ×1
SUT VIC AB 3-0 SH 27 (SUTURE) ×1
SUT VIC AB 3-0 SH 27X BRD (SUTURE) ×1 IMPLANT
SUT VIC AB 4-0 FS2 27 (SUTURE) ×2 IMPLANT
SWABSTK COMLB BENZOIN TINCTURE (MISCELLANEOUS) ×2 IMPLANT
SYR 10ML LL (SYRINGE) ×2 IMPLANT

## 2017-11-11 NOTE — Discharge Instructions (Addendum)
[facility]  POST OPERATIVE INSTRUCTIONS FOR DR. TROXLER AND DR. FOWLER [facility] PODIATRY DEPART[facility]   1. Take your medication as prescribed.  Pain medication should be taken only as needed.  2. Keep the dressing clean, dry and intact.  3. Keep your foot elevated above the heart level for the first 48 hours.  4. Walking to the bathroom and brief periods of walking are acceptable, unless we have instructed you to be non-weight bearing.  5. Always wear your post-op shoe when walking.  Always use your crutches if you are to be non-weight bearing.  6. Do not take a shower. Baths are permissible as long as the foot is kept out of the water.   7. Every hour you are awake:  - Bend your knee 15 times. - Flex foot 15 times - Massage calf 15 times  8. Call [facility] ([phone]) if any of the following problems occur: - You develop a temperature or fever. - The bandage becomes saturated with blood. - Medication does not stop your pain. - Injury of the foot occurs. - Any symptoms of infection including redness, odor, or red streaks running from wound.   AMBULATORY SURGERY  DISCHARGE INSTRUCTIONS   1) The drugs that you were given will stay in your system until tomorrow so for the next 24 hours you should not:  A) Drive an automobile B) Make any legal decisions C) Drink any alcoholic beverage   2) You may resume regular meals tomorrow.  Today it is better to start with liquids and gradually work up to solid foods.  You may eat anything you prefer, but it is better to start with liquids, then soup and crackers, and gradually work up to solid foods.   3) Please notify your doctor immediately if you have any unusual bleeding, trouble breathing, redness and pain at the surgery site, drainage, fever, or pain not relieved by medication.    4) Additional Instructions: TAKE A STOOL SOFTENER TWICE A DAY WHILE TAKING NARCOTIC  PAIN MEDICINE TO PREVENT CONSTIPATION   Please contact your physician with any problems or Same Day Surgery at [phone], Monday through Friday 6 am to 4 pm, or [place] [facility] [place] Main number at [phone].      

## 2017-11-11 NOTE — Anesthesia Preprocedure Evaluation (Signed)
Anesthesia Evaluation  Patient identified by MRN, date of birth, ID band Patient awake    Reviewed: Allergy & Precautions, H&P , NPO status , Patient's Chart, lab work & pertinent test results, reviewed documented beta blocker date and time   History of Anesthesia Complications Negative for: history of anesthetic complications  Airway Mallampati: I  TM Distance: >3 FB Neck ROM: full    Dental  (+) Caps, Dental Advidsory Given, Missing   Pulmonary neg pulmonary ROS, former smoker,           Cardiovascular Exercise Tolerance: Good hypertension, (-) angina+ CAD and + Cardiac Stents  (-) Past MI and (-) CABG + dysrhythmias Atrial Fibrillation (-) Valvular Problems/Murmurs     Neuro/Psych negative neurological ROS  negative psych ROS   GI/Hepatic negative GI ROS, Neg liver ROS,   Endo/Other  negative endocrine ROS  Renal/GU negative Renal ROS  negative genitourinary   Musculoskeletal   Abdominal   Peds  Hematology negative hematology ROS (+)   Anesthesia Other Findings Past Medical History: No date: Cancer ([place])     Comment:  SKIN No date: Cardiomyopathy ([place]) No date: Coronary artery disease No date: Dysrhythmia     Comment:  afib No date: Hypertension 10/07/2016: Hyperthyroidism     Comment:  From order and patient 2018: Subarachnoid hemorrhage following injury ([place]) No date: Thyroid nodule   Reproductive/Obstetrics negative OB ROS                             Anesthesia Physical Anesthesia Plan  ASA: III  Anesthesia Plan: General   Post-op Pain Management:    Induction: Intravenous  PONV Risk Score and Plan: 2 and Ondansetron, Dexamethasone and Treatment may vary due to age or medical condition  Airway Management Planned: LMA  Additional Equipment:   Intra-op Plan:   Post-operative Plan: Extubation in OR  Informed Consent: I have reviewed the patients History and  Physical, chart, labs and discussed the procedure including the risks, benefits and alternatives for the proposed anesthesia with the patient or authorized representative who has indicated his/her understanding and acceptance.   Dental Advisory Given  Plan Discussed with: Anesthesiologist, CRNA and Surgeon  Anesthesia Plan Comments:         Anesthesia Quick Evaluation

## 2017-11-11 NOTE — Anesthesia Post-op Follow-up Note (Signed)
Anesthesia QCDR form completed.        

## 2017-11-11 NOTE — Anesthesia Procedure Notes (Signed)
Procedure Name: LMA Insertion Date/Time: 11/11/2017 7:50 AM Performed by: Leander Rams, CRNA Pre-anesthesia Checklist: Patient identified, Emergency Drugs available, Suction available, Patient being monitored and Timeout performed Patient Re-evaluated:Patient Re-evaluated prior to induction Oxygen Delivery Method: Circle system utilized Preoxygenation: Pre-oxygenation with 100% oxygen Induction Type: IV induction Ventilation: Mask ventilation without difficulty LMA: LMA flexible inserted LMA Size: 4.0 Number of attempts: 1 Placement Confirmation: positive ETCO2 and breath sounds checked- equal and bilateral Tube secured with: Tape Dental Injury: Teeth and Oropharynx as per pre-operative assessment

## 2017-11-11 NOTE — Transfer of Care (Signed)
Immediate Anesthesia Transfer of Care Note  Patient: Steele Stracener Scherer  Procedure(s) Performed: [place] AND DESTRUCTION OF LEISION FIFTH TOE (Left Foot)  Patient Location: PACU  Anesthesia Type:General  Level of Consciousness: awake, alert  and oriented  Airway & Oxygen Therapy: Patient Spontanous Breathing and Patient connected to face mask oxygen  Post-op Assessment: Report given to RN and Post -op Vital signs reviewed and stable  Post vital signs: Reviewed and stable  Last Vitals:  Vitals Value Taken Time  BP 132/88 11/11/2017  8:53 AM  Temp 36.5 C 11/11/2017  8:53 AM  Pulse 68 11/11/2017  8:55 AM  Resp 13 11/11/2017  8:55 AM  SpO2 100 % 11/11/2017  8:55 AM  Vitals shown include unvalidated device data.  Last Pain:  Vitals:   11/11/17 0614  TempSrc: Tympanic  PainSc: 0-No pain         Complications: No apparent anesthesia complications

## 2017-11-11 NOTE — H&P (Signed)
HISTORY AND PHYSICAL INTERVAL NOTE:  11/11/2017  7:18 AM  Anthony [name]  has presented today for surgery, with the diagnosis of Hallux Rigidus - Left.  The various methods of treatment have been discussed with the patient.  No guarantees were given.  After consideration of risks, benefits and other options for treatment, the patient has consented to surgery.  I have reviewed the patients' chart and labs.     [name] history and physical examination was performed in my office.  The patient was reexamined.  There have been no changes to this history and physical examination.  Anthony [name]

## 2017-11-11 NOTE — Op Note (Addendum)
Operative note   Surgeon:Brantly Kalman Lawyer: None    Preop diagnosis: 1.Hallux limitus left first MTPJ   2.  Painful skin lesion plantar left fifth MTPJ    Postop diagnosis: Same    Procedure: 1.  Cheilectomy left first MTPJ 2.  Excision painful skin lesion plantar left fifth MTPJ 1.6 x 0.4 cm.  3.  Intermediate closure plantar left fifth skin lesion   4.  Intraoperative fluoroscopy    EBL: Minimal    Anesthesia:local and general.  Local consisted of a one-to-one mixture of 0.25% bupivacaine and Exparel long-acting anesthetic.  A total of 20 cc was used initially.  10 cc of 0.25 bupivacaine was used at the end of the case    Hemostasis: Ankle tourniquet inflated to 200 mmHg for 45 minutes    Specimen: 1.  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  Skin lesion plantar left fifth MTPJ    Complications: None    Operative indications:Anthony [name] is an 77 y.o. that presents today for surgical intervention.  The risks/benefits/alternatives/complications have been discussed and consent has been given.    Procedure:  Patient was brought into the OR and placed on the operating table in thesupine position. After anesthesia was obtained theleft lower extremity was prepped and draped in usual sterile fashion.  Attention was directed to the dorsomedial left first MTPJ where dorsal medial incision was performed.  Sharp and blunt dissection carried down to the capsule.  A longitudinal capsulotomy was performed.  This exposed the first MTPJ.  There was a large loose body on the dorsal medial aspect and this was removed and sent for pathological examination.  A large rim of bony exostosis was noted around the head of the first metatarsal and base of the proximal phalanx.  This was then excised with a rondure.  A large gouty tophus was noted on the medial aspect of the first MTPJ and this was then excised.  The base of the proximal phalanx was smoothed as well as the head of the  metatarsal smoothed down to a much more normal appearing anatomy.  The articular cartilage was evaluated.  The dorsal one half of the articular cartilage on the metatarsal head and the proximal phalanx was very thin but not down to subchondral bone plate.  The plantar one half of the articular cartilage appeared to be normal.  Intraoperative use of fluoroscopy was performed prior to removal of exostosis and after removal of exostosis to evaluate amount of bone removed as well as evaluate for residual exostosis.  The wound was then flushed with copious amounts of irrigation.  Visual was performed with a 3-0 Vicryl for the capsule 4-0 Vicryl for the subcutaneous tissue and a 5-0 Monocryl undyed for skin.  The joint was infiltrated with 10 mg of dexamethasone.  Attention was then directed to the plantar lateral aspect of the left foot where a small benign-appearing lesion was noted under the fifth MTPJ.  This was painful for the patient preoperatively.  A 3-1 ellipse was performed.  Length was 1.6 with a width of approximately 0.4 cm.  This was excised down into the subcutaneous tissue.  The wound was flushed with copious amounts of irrigation.  Closure was performed with 4-0 Vicryl and a 4-0 nylon.  Lesion was sent for pathological evaluation.    Patient tolerated the procedure and anesthesia well.  Was transported from the OR to the PACU with all vital signs stable and vascular status intact.  To be discharged per routine protocol.  Will follow up in approximately 1 week in the outpatient clinic.

## 2017-11-12 NOTE — Anesthesia Postprocedure Evaluation (Signed)
Anesthesia Post Note  Patient: Rube Sanchez Weller  Procedure(s) Performed: CHEILECTOMY-HALLUX VALGUS AND DESTRUCTION OF LEISION FIFTH TOE (Left Foot)  Patient location during evaluation: PACU Anesthesia Type: General Level of consciousness: awake and alert Pain management: pain level controlled Vital Signs Assessment: post-procedure vital signs reviewed and stable Respiratory status: spontaneous breathing, nonlabored ventilation, respiratory function stable and patient connected to nasal cannula oxygen Cardiovascular status: blood pressure returned to baseline and stable Postop Assessment: no apparent nausea or vomiting Anesthetic complications: no     Last Vitals:  Vitals:   11/11/17 0939 11/11/17 1013  BP: 133/78 125/73  Pulse: 68 79  Resp: 16 16  Temp: 36.7 C   SpO2: 97% 97%    Last Pain:  Vitals:   11/11/17 1013  TempSrc:   PainSc: 0-No pain                 Martha Clan

## 2017-11-13 ENCOUNTER — Encounter: Payer: Self-pay | Admitting: Podiatry

## 2017-11-15 LAB — SURGICAL PATHOLOGY

## 2018-07-26 DIAGNOSIS — C4491 Basal cell carcinoma of skin, unspecified: Secondary | ICD-10-CM

## 2018-07-26 HISTORY — DX: Basal cell carcinoma of skin, unspecified: C44.91

## 2018-08-18 ENCOUNTER — Other Ambulatory Visit
Admission: RE | Admit: 2018-08-18 | Discharge: 2018-08-18 | Disposition: A | Discharge status: Home / Self Care | Payer: Medicare Other | Source: Ambulatory Visit | Attending: Gastroenterology | Admitting: Gastroenterology

## 2018-08-18 ENCOUNTER — Other Ambulatory Visit: Payer: Self-pay

## 2018-08-18 DIAGNOSIS — Z01812 Encounter for preprocedural laboratory examination: Secondary | ICD-10-CM | POA: Insufficient documentation

## 2018-08-18 DIAGNOSIS — Z20828 Contact with and (suspected) exposure to other viral communicable diseases: Secondary | ICD-10-CM | POA: Diagnosis not present

## 2018-08-19 LAB — SARS CORONAVIRUS 2 (TAT 6-24 HRS): SARS Coronavirus 2: NEGATIVE

## 2018-08-21 ENCOUNTER — Encounter: Payer: Self-pay | Admitting: *Deleted

## 2018-08-22 ENCOUNTER — Encounter: Payer: Self-pay | Admitting: *Deleted

## 2018-08-22 ENCOUNTER — Ambulatory Visit: Payer: Medicare Other | Admitting: Anesthesiology

## 2018-08-22 ENCOUNTER — Encounter
Admission: RE | Disposition: A | Discharge status: Home / Self Care | Payer: Self-pay | Source: Home / Self Care | Attending: Gastroenterology

## 2018-08-22 ENCOUNTER — Other Ambulatory Visit: Payer: Self-pay

## 2018-08-22 ENCOUNTER — Ambulatory Visit
Admission: RE | Admit: 2018-08-22 | Discharge: 2018-08-22 | Disposition: A | Discharge status: Home / Self Care | Payer: Medicare Other | Attending: Gastroenterology | Admitting: Gastroenterology

## 2018-08-22 DIAGNOSIS — Z7982 Long term (current) use of aspirin: Secondary | ICD-10-CM | POA: Diagnosis not present

## 2018-08-22 DIAGNOSIS — E0590 Thyrotoxicosis, unspecified without thyrotoxic crisis or storm: Secondary | ICD-10-CM | POA: Diagnosis not present

## 2018-08-22 DIAGNOSIS — Z85828 Personal history of other malignant neoplasm of skin: Secondary | ICD-10-CM | POA: Insufficient documentation

## 2018-08-22 DIAGNOSIS — I4891 Unspecified atrial fibrillation: Secondary | ICD-10-CM | POA: Insufficient documentation

## 2018-08-22 DIAGNOSIS — K5730 Diverticulosis of large intestine without perforation or abscess without bleeding: Secondary | ICD-10-CM | POA: Insufficient documentation

## 2018-08-22 DIAGNOSIS — K640 First degree hemorrhoids: Secondary | ICD-10-CM | POA: Diagnosis not present

## 2018-08-22 DIAGNOSIS — I10 Essential (primary) hypertension: Secondary | ICD-10-CM | POA: Insufficient documentation

## 2018-08-22 DIAGNOSIS — D123 Benign neoplasm of transverse colon: Secondary | ICD-10-CM | POA: Diagnosis not present

## 2018-08-22 DIAGNOSIS — I2510 Atherosclerotic heart disease of native coronary artery without angina pectoris: Secondary | ICD-10-CM | POA: Diagnosis not present

## 2018-08-22 DIAGNOSIS — Z7901 Long term (current) use of anticoagulants: Secondary | ICD-10-CM | POA: Insufficient documentation

## 2018-08-22 DIAGNOSIS — Z1211 Encounter for screening for malignant neoplasm of colon: Secondary | ICD-10-CM | POA: Diagnosis present

## 2018-08-22 DIAGNOSIS — Z79899 Other long term (current) drug therapy: Secondary | ICD-10-CM | POA: Diagnosis not present

## 2018-08-22 DIAGNOSIS — I429 Cardiomyopathy, unspecified: Secondary | ICD-10-CM | POA: Insufficient documentation

## 2018-08-22 HISTORY — PX: COLONOSCOPY: SHX5424

## 2018-08-22 HISTORY — DX: Diverticulosis of intestine, part unspecified, without perforation or abscess without bleeding: K57.90

## 2018-08-22 SURGERY — COLONOSCOPY
Anesthesia: General

## 2018-08-22 MED ORDER — PROPOFOL 500 MG/50ML IV EMUL
INTRAVENOUS | Status: DC | PRN
  Administered 2018-08-22: 100 ug/kg/min via INTRAVENOUS

## 2018-08-22 MED ORDER — PROPOFOL 500 MG/50ML IV EMUL
INTRAVENOUS | Status: AC
  Filled 2018-08-22: qty 50

## 2018-08-22 MED ORDER — SODIUM CHLORIDE 0.9 % IV SOLN
INTRAVENOUS | Status: DC
  Administered 2018-08-22: 07:00:00 via INTRAVENOUS

## 2018-08-22 MED ORDER — FENTANYL CITRATE (PF) 100 MCG/2ML IJ SOLN
INTRAMUSCULAR | Status: DC | PRN
  Administered 2018-08-22: 50 ug via INTRAVENOUS

## 2018-08-22 MED ORDER — FENTANYL CITRATE (PF) 100 MCG/2ML IJ SOLN
INTRAMUSCULAR | Status: AC
  Filled 2018-08-22: qty 2

## 2018-08-22 MED ORDER — MIDAZOLAM HCL 2 MG/2ML IJ SOLN
INTRAMUSCULAR | Status: AC
  Filled 2018-08-22: qty 2

## 2018-08-22 MED ORDER — MIDAZOLAM HCL 2 MG/2ML IJ SOLN
INTRAMUSCULAR | Status: DC | PRN
  Administered 2018-08-22: 2 mg via INTRAVENOUS

## 2018-08-22 NOTE — Transfer of Care (Signed)
Immediate Anesthesia Transfer of Care Note  Patient: Anthony [name]  Procedure(s) Performed: COLONOSCOPY (N/A )  Patient Location: PACU  Anesthesia Type:General  Level of Consciousness: awake and sedated  Airway & Oxygen Therapy: Patient Spontanous Breathing and Patient connected to face mask oxygen  Post-op Assessment: Report given to RN and Post -op Vital signs reviewed and stable  Post vital signs: Reviewed and stable  Last Vitals:  Vitals Value Taken Time  BP    Temp    Pulse    Resp    SpO2      Last Pain:  Vitals:   08/22/18 0709  TempSrc: Tympanic  PainSc: 0-No pain         Complications: No apparent anesthesia complications

## 2018-08-22 NOTE — Anesthesia Procedure Notes (Signed)
Performed by: Vaughan Sine Pre-anesthesia Checklist: Patient identified, Emergency Drugs available, Suction available, Patient being monitored and Timeout performed Patient Re-evaluated:Patient Re-evaluated prior to induction Oxygen Delivery Method: Nasal cannula and Simple face mask Preoxygenation: Pre-oxygenation with 100% oxygen Induction Type: IV induction Placement Confirmation: CO2 detector and positive ETCO2

## 2018-08-22 NOTE — Anesthesia Preprocedure Evaluation (Signed)
Anesthesia Evaluation  Patient identified by MRN, date of birth, ID band Patient awake    Reviewed: Allergy & Precautions, NPO status , Patient's Chart, lab work & pertinent test results, reviewed documented beta blocker date and time   Airway Mallampati: III  TM Distance: >3 FB     Dental  (+) Chipped   Pulmonary former smoker,           Cardiovascular hypertension, Pt. on medications and Pt. on home beta blockers + CAD  + dysrhythmias Atrial Fibrillation      Neuro/Psych    GI/Hepatic   Endo/Other  Hyperthyroidism   Renal/GU      Musculoskeletal   Abdominal   Peds  Hematology   Anesthesia Other Findings   Reproductive/Obstetrics                             Anesthesia Physical Anesthesia Plan  ASA: III  Anesthesia Plan: General   Post-op Pain Management:    Induction: Intravenous  PONV Risk Score and Plan:   Airway Management Planned:   Additional Equipment:   Intra-op Plan:   Post-operative Plan:   Informed Consent: I have reviewed the patients History and Physical, chart, labs and discussed the procedure including the risks, benefits and alternatives for the proposed anesthesia with the patient or authorized representative who has indicated his/her understanding and acceptance.       Plan Discussed with: CRNA  Anesthesia Plan Comments:         Anesthesia Quick Evaluation

## 2018-08-22 NOTE — Anesthesia Post-op Follow-up Note (Signed)
Anesthesia QCDR form completed.        

## 2018-08-22 NOTE — H&P (Signed)
Outpatient short stay form Pre-procedure 08/22/2018 7:43 AM Lollie Sails MD  Primary Physician: Dr Dion Body  Reason for visit: Colonoscopy  History of present illness: Patient is a 78 year old male presenting today for colonoscopy.  He has a personal history of adenomatous colon polyps with his last colonoscopy being 07/23/2013.  He tolerated his prep well.  He does take Eliquis which is been held for over 72 hours.  He also takes 81 mg aspirin but no other aspirin product.    Current Facility-Administered Medications:  .  0.9 %  sodium chloride infusion, , Intravenous, Continuous, Lollie Sails, MD, Last Rate: 20 mL/hr at 08/22/18 2725  Medications Prior to Admission  Medication Sig Dispense Refill Last Dose  . acetaminophen (TYLENOL) 650 MG CR tablet Take 650 mg by mouth 2 (two) times daily.   Past Week at Unknown time  . apixaban (ELIQUIS) 5 MG TABS tablet Take 5 mg by mouth 2 (two) times daily.   08/11/2018 at Unknown time  . aspirin EC 81 MG tablet Take 81 mg by mouth daily.   08/19/2018 at Unknown time  . carvedilol (COREG) 12.5 MG tablet Take 12.5 mg by mouth 2 (two) times daily with a meal.   08/21/2018 at 2100  . CORAL CALCIUM PO Take 1 tablet by mouth daily.   Past Month at Unknown time  . EPINEPHrine 0.3 mg/0.3 mL IJ SOAJ injection Inject 0.3 mg into the muscle as needed for anaphylaxis.   Past Month at Unknown time  . Multiple Vitamins-Minerals (MULTIVITAMIN PO) Take 1 tablet by mouth daily.   Past Week at Unknown time  . simvastatin (ZOCOR) 20 MG tablet Take 20 mg by mouth daily.   08/21/2018 at 2100  . HYDROcodone-acetaminophen (NORCO) 5-325 MG tablet Take 1-2 tablets by mouth every 6 (six) hours as needed for moderate pain. 30 tablet 0      Allergies  Allergen Reactions  . Hornet Venom Swelling     Past Medical History:  Diagnosis Date  . Cancer ([place])    SKIN  . Cardiomyopathy ([place])   . Coronary artery disease   . Diverticulosis   . Dysrhythmia    afib  . Hypertension   . Hyperthyroidism 10/07/2016   From order and patient  . Subarachnoid hemorrhage following injury ([place]) 2018  . Thyroid nodule     Review of systems:      Physical Exam    Heart and lungs: Regular rate and rhythm without rub or gallop lungs are bilaterally clear    HEENT: Normocephalic atraumatic eyes are anicteric    Other:    Pertinant exam for procedure: Soft nontender nondistended bowel sounds positive normoactive    Planned proceedures: Colonoscopy and indicated procedures. I have discussed the risks benefits and complications of procedures to include not limited to bleeding, infection, perforation and the risk of sedation and the patient wishes to proceed.    Lollie Sails, MD Gastroenterology 08/22/2018  7:43 AM

## 2018-08-22 NOTE — Anesthesia Postprocedure Evaluation (Signed)
Anesthesia Post Note  Patient: Anthony [name]  Procedure([name]) Performed: COLONOSCOPY (N/A )  Patient location during evaluation: Endoscopy Anesthesia Type: General Level of consciousness: awake and alert Pain management: pain level controlled Vital Signs Assessment: post-procedure vital signs reviewed and stable Respiratory status: spontaneous breathing, nonlabored ventilation, respiratory function stable and patient connected to nasal cannula oxygen Cardiovascular status: blood pressure returned to baseline and stable Postop Assessment: no apparent nausea or vomiting Anesthetic complications: no     Last Vitals:  Vitals:   08/22/18 0840 08/22/18 0850  BP: 129/81 138/84  Pulse: 68 68  Resp: (!) 22 18  Temp:    SpO2: 100% 100%    Last Pain:  Vitals:   08/22/18 0820  TempSrc: Tympanic  PainSc:                  Anthony [name]

## 2018-08-22 NOTE — Op Note (Signed)
[facility] Gastroenterology Patient Name: Mar Walmer Procedure Date: 08/22/2018 7:43 AM MRN: [id number] Account #: [id number] Date of Birth: 1940/09/28 Admit Type: Outpatient Age: 78 Room: [facility] ENDO ROOM 3 Gender: Male Note Status: Finalized Procedure:            Colonoscopy Indications:          Personal history of colonic polyps Providers:            Lollie Sails, MD Referring MD:         Dion Body (Referring MD) Medicines:            Monitored Anesthesia Care Complications:        No immediate complications. Procedure:            Pre-Anesthesia Assessment:                       - ASA Grade Assessment: III - A patient with severe                        systemic disease.                       After obtaining informed consent, the colonoscope was                        passed under direct vision. Throughout the procedure,                        the patient's blood pressure, pulse, and oxygen                        saturations were monitored continuously. The                        Colonoscope was introduced through the anus and                        advanced to the the cecum, identified by appendiceal                        orifice and ileocecal valve. The colonoscopy was                        performed without difficulty. The patient tolerated the                        procedure well. The quality of the bowel preparation                        was good. Findings:      Many small and large-mouthed diverticula were found in the sigmoid colon       and distal descending colon.      Two sessile polyps were found in the hepatic flexure. The polyps were 2       to 5 mm in size. These polyps were removed with a piecemeal technique       using a cold biopsy forceps. Resection and retrieval were complete.      Non-bleeding internal hemorrhoids were found during retroflexion and       during anoscopy. The hemorrhoids were small and Grade I (internal  hemorrhoids that do not prolapse).      No additional abnormalities were found on retroflexion.      The digital rectal exam was normal otherwise. Impression:           - Diverticulosis in the sigmoid colon and in the distal                        descending colon.                       - Two 2 to 5 mm polyps at the hepatic flexure, removed                        piecemeal using a cold biopsy forceps. Resected and                        retrieved.                       - Non-bleeding internal hemorrhoids. Recommendation:       - Discharge patient to home.                       - Telephone GI clinic for pathology results in 5 days.                       - Soft diet today, then advance as tolerated to advance                        diet as tolerated. Procedure Code(s):    --- Professional ---                       [id number], Colonoscopy, flexible; with biopsy, single or                        multiple Diagnosis Code(s):    --- Professional ---                       K64.0, First degree hemorrhoids                       K63.5, Polyp of colon                       Z86.010, Personal history of colonic polyps                       K57.30, Diverticulosis of large intestine without                        perforation or abscess without bleeding CPT copyright 2019 American Medical Association. All rights reserved. The codes documented in this report are preliminary and upon coder review may  be revised to meet current compliance requirements. Lollie Sails, MD 08/22/2018 8:21:43 AM This report has been signed electronically. Number of Addenda: 0 Note Initiated On: 08/22/2018 7:43 AM Scope Withdrawal Time: 0 hours 13 minutes 44 seconds  Total Procedure Duration: 0 hours 23 minutes 22 seconds       [facility]

## 2018-08-23 LAB — SURGICAL PATHOLOGY

## 2019-04-24 ENCOUNTER — Encounter: Payer: Medicare Other | Admitting: Dermatology

## 2019-06-19 ENCOUNTER — Other Ambulatory Visit: Payer: Self-pay

## 2019-06-19 ENCOUNTER — Encounter: Payer: Self-pay | Admitting: Emergency Medicine

## 2019-06-19 ENCOUNTER — Emergency Department
Admission: EM | Admit: 2019-06-19 | Discharge: 2019-06-20 | Disposition: A | Discharge status: Home / Self Care | Payer: Medicare Other | Attending: Emergency Medicine | Admitting: Emergency Medicine

## 2019-06-19 DIAGNOSIS — I10 Essential (primary) hypertension: Secondary | ICD-10-CM | POA: Insufficient documentation

## 2019-06-19 DIAGNOSIS — Z7982 Long term (current) use of aspirin: Secondary | ICD-10-CM | POA: Insufficient documentation

## 2019-06-19 DIAGNOSIS — Y999 Unspecified external cause status: Secondary | ICD-10-CM | POA: Diagnosis not present

## 2019-06-19 DIAGNOSIS — Z79899 Other long term (current) drug therapy: Secondary | ICD-10-CM | POA: Insufficient documentation

## 2019-06-19 DIAGNOSIS — S90872A Other superficial bite of left foot, initial encounter: Secondary | ICD-10-CM | POA: Diagnosis present

## 2019-06-19 DIAGNOSIS — Z7901 Long term (current) use of anticoagulants: Secondary | ICD-10-CM | POA: Insufficient documentation

## 2019-06-19 DIAGNOSIS — Z23 Encounter for immunization: Secondary | ICD-10-CM | POA: Diagnosis not present

## 2019-06-19 DIAGNOSIS — W5911XA Bitten by nonvenomous snake, initial encounter: Secondary | ICD-10-CM | POA: Diagnosis not present

## 2019-06-19 DIAGNOSIS — Y93H2 Activity, gardening and landscaping: Secondary | ICD-10-CM | POA: Diagnosis not present

## 2019-06-19 DIAGNOSIS — Y92007 Garden or yard of unspecified non-institutional (private) residence as the place of occurrence of the external cause: Secondary | ICD-10-CM | POA: Diagnosis not present

## 2019-06-19 DIAGNOSIS — I2510 Atherosclerotic heart disease of native coronary artery without angina pectoris: Secondary | ICD-10-CM | POA: Diagnosis not present

## 2019-06-19 DIAGNOSIS — Z87891 Personal history of nicotine dependence: Secondary | ICD-10-CM | POA: Insufficient documentation

## 2019-06-19 LAB — CBC WITH DIFFERENTIAL/PLATELET
Abs Immature Granulocytes: 0.02 10*3/uL (ref 0.00–0.07)
Basophils Absolute: 0 10*3/uL (ref 0.0–0.1)
Basophils Relative: 0 %
Eosinophils Absolute: 0.1 10*3/uL (ref 0.0–0.5)
Eosinophils Relative: 2 %
HCT: 41.7 % (ref 39.0–52.0)
Hemoglobin: 14.1 g/dL (ref 13.0–17.0)
Immature Granulocytes: 0 %
Lymphocytes Relative: 22 %
Lymphs Abs: 1.3 10*3/uL (ref 0.7–4.0)
MCH: 30.5 pg (ref 26.0–34.0)
MCHC: 33.8 g/dL (ref 30.0–36.0)
MCV: 90.1 fL (ref 80.0–100.0)
Monocytes Absolute: 0.8 10*3/uL (ref 0.1–1.0)
Monocytes Relative: 13 %
Neutro Abs: 3.8 10*3/uL (ref 1.7–7.7)
Neutrophils Relative %: 63 %
Platelets: 153 10*3/uL (ref 150–400)
RBC: 4.63 MIL/uL (ref 4.22–5.81)
RDW: 12.4 % (ref 11.5–15.5)
WBC: 6 10*3/uL (ref 4.0–10.5)
nRBC: 0 % (ref 0.0–0.2)

## 2019-06-19 LAB — PROTIME-INR
INR: 1.3 — ABNORMAL HIGH (ref 0.8–1.2)
Prothrombin Time: 16.1 seconds — ABNORMAL HIGH (ref 11.4–15.2)

## 2019-06-19 LAB — BASIC METABOLIC PANEL
Anion gap: 6 (ref 5–15)
BUN: 17 mg/dL (ref 8–23)
CO2: 30 mmol/L (ref 22–32)
Calcium: 8.8 mg/dL — ABNORMAL LOW (ref 8.9–10.3)
Chloride: 103 mmol/L (ref 98–111)
Creatinine, Ser: 0.97 mg/dL (ref 0.61–1.24)
GFR calc Af Amer: >60 mL/min (ref >60)
GFR calc non Af Amer: >60 mL/min (ref >60)
Glucose, Bld: 102 mg/dL — ABNORMAL HIGH (ref 70–99)
Potassium: 4.2 mmol/L (ref 3.5–5.1)
Sodium: 139 mmol/L (ref 135–145)

## 2019-06-19 LAB — FIBRINOGEN: Fibrinogen: 623 mg/dL — ABNORMAL HIGH (ref 210–475)

## 2019-06-19 LAB — APTT: aPTT: 37 seconds — ABNORMAL HIGH (ref 24–36)

## 2019-06-19 MED ORDER — OXYCODONE-ACETAMINOPHEN 5-325 MG PO TABS: 1.0000 | ORAL_TABLET | Freq: Four times a day (QID) | ORAL | 0 refills | Status: AC | PRN

## 2019-06-19 MED ORDER — TETANUS-DIPHTH-ACELL PERTUSSIS 5-2.5-18.5 LF-MCG/0.5 IM SUSP
0.5000 mL | Freq: Once | INTRAMUSCULAR | Status: AC
  Administered 2019-06-19: 0.5 mL via INTRAMUSCULAR
  Filled 2019-06-19: qty 0.5

## 2019-06-19 MED ORDER — DOXYCYCLINE HYCLATE 100 MG PO CAPS: 100.0000 mg | ORAL_CAPSULE | Freq: Two times a day (BID) | ORAL | 0 refills | Status: AC

## 2019-06-19 MED ORDER — OXYCODONE-ACETAMINOPHEN 5-325 MG PO TABS
1.0000 | ORAL_TABLET | Freq: Once | ORAL | Status: AC
  Administered 2019-06-19: 1 via ORAL
  Filled 2019-06-19: qty 1

## 2019-06-19 NOTE — ED Provider Notes (Signed)
[facility] Emergency Department Provider Note  ____________________________________________   First MD Initiated Contact with Patient 06/19/19 1851     (approximate)  I have reviewed the triage vital signs and the nursing notes.   HISTORY  Chief Complaint Animal Bite    HPI Anthony [name] is a 79 y.o. male  Here with left foot pain and swelling. Pt has a h/o cardiomyopathy, HTN, HLD, CAD on Eliquis.  The patient was out in his yard mowing the grass today.  He states he felt a stinging sensation in his foot.  He had socks in his shoe on.  He looked down and there was a small amount of debris on his foot from mowing.  He states he pulled the soft.  Since then, he has had mild, aching, throbbing, left medial ankle pain and some redness.  He did not see a snake.  He went home and due to possibly seeing what he believes looked like puncture wounds, he decided to come to the ER.  No bleeding.  No worsening pain or streaking redness.  No shortness of breath.  No other complaints.  No numbness or tingling.       Past Medical History:  Diagnosis Date  . Cancer ([place])    SKIN  . Cardiomyopathy ([place])   . Coronary artery disease   . Diverticulosis   . Dysrhythmia    afib  . Hypertension   . Hyperthyroidism 10/07/2016   From order and patient  . Subarachnoid hemorrhage following injury ([place]) 2018  . Thyroid nodule     There are no problems to display for this patient.   Past Surgical History:  Procedure Laterality Date  . CHEILECTOMY Left 11/11/2017   Procedure: CHEILECTOMY-HALLUX VALGUS AND DESTRUCTION OF LEISION FIFTH TOE;  Surgeon: Samara Deist, DPM;  Location: [facility] ORS;  Service: Podiatry;  Laterality: Left;  . COLONOSCOPY N/A 08/22/2018   Procedure: COLONOSCOPY;  Surgeon: Lollie Sails, MD;  Location: [facility] ENDOSCOPY;  Service: Endoscopy;  Laterality: N/A;  . CORONARY ANGIOPLASTY     X 2 STENTS/ 2000/2001  . FOOT SURGERY     base of big  toe  . JOINT REPLACEMENT Bilateral    THR left 2011/ Right2017  . TONSILLECTOMY      Prior to Admission medications   Medication Sig Start Date End Date Taking? Authorizing Provider  acetaminophen (TYLENOL) 650 MG CR tablet Take 650 mg by mouth 2 (two) times daily.    [provider]  apixaban (ELIQUIS) 5 MG TABS tablet Take 5 mg by mouth 2 (two) times daily.    [provider]  aspirin EC 81 MG tablet Take 81 mg by mouth daily.    [provider]  carvedilol (COREG) 12.5 MG tablet Take 12.5 mg by mouth 2 (two) times daily with a meal.    [provider]  CORAL CALCIUM PO Take 1 tablet by mouth daily.    [provider]  doxycycline (VIBRAMYCIN) 100 MG capsule Take 1 capsule (100 mg total) by mouth 2 (two) times daily for 7 days. 06/19/19 06/26/19  Duffy Bruce, MD  EPINEPHrine 0.3 mg/0.3 mL IJ SOAJ injection Inject 0.3 mg into the muscle as needed for anaphylaxis.    [provider]  HYDROcodone-acetaminophen (NORCO) 5-325 MG tablet Take 1-2 tablets by mouth every 6 (six) hours as needed for moderate pain. 11/11/17   Samara Deist, DPM  Multiple Vitamins-Minerals (MULTIVITAMIN PO) Take 1 tablet by mouth daily.  [provider]  oxyCODONE-acetaminophen (PERCOCET) 5-325 MG tablet Take 1 tablet by mouth every 6 (six) hours as needed for severe pain. 06/19/19 06/18/20  Duffy Bruce, MD  simvastatin (ZOCOR) 20 MG tablet Take 20 mg by mouth daily.    [provider]    Allergies Hornet venom  No family history on file.  Social History Social History   Tobacco Use  . Smoking status: Former Smoker    Types: Pipe, Cigars    Quit date: 11/02/2015    Years since quitting: 3.6  . Smokeless tobacco: Never Used  Vaping Use  . Vaping Use: Never used  Substance Use Topics  . Alcohol use: Yes    Comment: OCCAS  . Drug use: Never    Review of Systems  Review of Systems  Constitutional: Negative for chills and  fever.  HENT: Negative for sore throat.   Respiratory: Negative for shortness of breath.   Cardiovascular: Positive for leg swelling. Negative for chest pain.  Gastrointestinal: Negative for abdominal pain.  Genitourinary: Negative for flank pain.  Musculoskeletal: Negative for neck pain.  Skin: Positive for rash and wound.  Allergic/Immunologic: Negative for immunocompromised state.  Neurological: Negative for weakness and numbness.  Hematological: Does not bruise/bleed easily.  All other systems reviewed and are negative.    ____________________________________________  PHYSICAL EXAM:      VITAL SIGNS: ED Triage Vitals  Enc Vitals Group     BP 06/19/19 1751 (!) 151/79     Pulse Rate 06/19/19 1751 72     Resp 06/19/19 1751 16     Temp 06/19/19 1751 99.1 F (37.3 C)     Temp Source 06/19/19 1751 Oral     SpO2 06/19/19 1751 96 %     Weight 06/19/19 1732 200 lb (90.7 kg)     Height 06/19/19 1732 5\' 8"  (1.727 m)     Head Circumference --      Peak Flow --      Pain Score 06/19/19 1732 5     Pain Loc --      Pain Edu? --      Excl. in [place]? --      Physical Exam Vitals and nursing note reviewed.  Constitutional:      General: He is not in acute distress.    Appearance: He is well-developed.  HENT:     Head: Normocephalic and atraumatic.  Eyes:     Conjunctiva/sclera: Conjunctivae normal.  Cardiovascular:     Rate and Rhythm: Normal rate and regular rhythm.     Heart sounds: Normal heart sounds.  Pulmonary:     Effort: Pulmonary effort is normal. No respiratory distress.     Breath sounds: No wheezing.  Abdominal:     General: There is no distension.  Musculoskeletal:     Cervical back: Neck supple.  Skin:    General: Skin is warm.     Capillary Refill: Capillary refill takes less than 2 seconds.     Findings: No rash.  Neurological:     Mental Status: He is alert and oriented to person, place, and time.     Motor: No abnormal muscle tone.      LOWER  EXTREMITY EXAM: LEFT  INSPECTION & PALPATION: Moderate erythema and swelling to medial ankle and proximal foot, with associated tenderness. No streaking of redness proximal to the area. No significant wounds noted. No bullae or purpura.  SENSORY: sensation is intact to light touch in:  Superficial peroneal nerve distribution (  over dorsum of foot) Deep peroneal nerve distribution (over first dorsal web space) Sural nerve distribution (over lateral aspect 5th metatarsal) Saphenous nerve distribution (over medial instep)  MOTOR:  + Motor EHL (great toe dorsiflexion) + FHL (great toe plantar flexion)  + TA (ankle dorsiflexion)  + GSC (ankle plantar flexion)  VASCULAR: 2+ dorsalis pedis and posterior tibialis pulses Capillary refill < 2 sec, toes warm and well-perfused  COMPARTMENTS: Soft, warm, well-perfused No pain with passive extension No parethesias    ____________________________________________   LABS (all labs ordered are listed, but only abnormal results are displayed)  Labs Reviewed  BASIC METABOLIC PANEL - Abnormal; Notable for the following components:      Result Value   Glucose, Bld 102 (*)    Calcium 8.8 (*)    All other components within normal limits  PROTIME-INR - Abnormal; Notable for the following components:   Prothrombin Time 16.1 (*)    INR 1.3 (*)    All other components within normal limits  APTT - Abnormal; Notable for the following components:   aPTT 37 (*)    All other components within normal limits  FIBRINOGEN - Abnormal; Notable for the following components:   Fibrinogen 623 (*)    All other components within normal limits  CBC WITH DIFFERENTIAL/PLATELET    ____________________________________________  EKG: None ________________________________________  RADIOLOGY All imaging, including plain films, CT scans, and ultrasounds, independently reviewed by me, and interpretations confirmed via formal radiology reads.  ED MD  interpretation:   None  Official radiology report(s): No results found.  ____________________________________________  PROCEDURES   Procedure(s) performed (including Critical Care):  Procedures  ____________________________________________  INITIAL IMPRESSION / MDM / [place] / ED COURSE  As part of my medical decision making, I reviewed the following data within the [place] notes reviewed and incorporated, Old chart reviewed, Notes from prior ED visits, and [place] Controlled Substance Database       *Anthony [name] was evaluated in Emergency Department on 06/19/2019 for the symptoms described in the history of present illness. He was evaluated in the context of the global COVID-19 pandemic, which necessitated consideration that the patient might be at risk for infection with the SARS-CoV-2 virus that causes COVID-19. Institutional protocols and algorithms that pertain to the evaluation of patients at risk for COVID-19 are in a state of rapid change based on information released by regulatory bodies including the CDC and federal and state organizations. These policies and algorithms were followed during the patient's care in the ED.  Some ED evaluations and interventions may be delayed as a result of limited staffing during the pandemic.*     Medical Decision Making:  79 yo M here with possible snake bite to his left medial ankle. Exam is c/w local allergic/irritant reaction, possibly insect bite, local irritation from debris, or snake bite with minimal envenomation. He was well prior to the incident. Pt is HDS, well appearing. Pain controlled. Discussed with Poison Control. CBC is reassuring, plt normal, and fibrinogen is normal to elevated - all reassuring. INR slightly elevated 2/2 his eliquis. Will monitor x 8 hours from bite per Poison Control. If pain is improved and no worsening, do not need to repeat labs and pt can be d/c. Given his  comorbidities and redness, will elect for empiric ABX at home. Signed out to Dr. Quentin Cornwall who will sign out to oncoming provider.  ____________________________________________  FINAL CLINICAL IMPRESSION(S) / ED DIAGNOSES  Final diagnoses:  Snake bite, initial encounter     MEDICATIONS GIVEN DURING THIS VISIT:  Medications  oxyCODONE-acetaminophen (PERCOCET/ROXICET) 5-325 MG per tablet 1 tablet (1 tablet Oral Given 06/19/19 1915)  Tdap (BOOSTRIX) injection 0.5 mL (0.5 mLs Intramuscular Given 06/19/19 2058)     ED Discharge Orders         Ordered    oxyCODONE-acetaminophen (PERCOCET) 5-325 MG tablet  Every 6 hours PRN     Discontinue  Reprint     06/19/19 2104    doxycycline (VIBRAMYCIN) 100 MG capsule  2 times daily     Discontinue  Reprint     06/19/19 2104           Note:  This document was prepared using Dragon voice recognition software and may include unintentional dictation errors.   Duffy Bruce, MD 06/19/19 2310

## 2019-06-19 NOTE — ED Notes (Signed)
Pt requesting something to eat. Will look for a tray.

## 2019-06-19 NOTE — ED Notes (Signed)
See triage note  Presents with area to left ankle  States he felt a prick to ankle while mowing grass  Thought he saw 2 puncture wounds  Area is red and was marked in triage

## 2019-06-19 NOTE — ED Notes (Signed)
Per Barnett Applebaum w/ poison control patient to elevate extremity on multiple pillows, monitor redness over the next 8 hours. Pt does not need coagulation 6 hours post bite if patients symptoms remain stable. No ice, no prophylactic steroids. Bite site needs to be washed.

## 2019-06-19 NOTE — ED Notes (Signed)
LLL elevated per poison control and MD.

## 2019-06-19 NOTE — ED Triage Notes (Signed)
Pt reports was working outside and felt a prick in his left ankle. Pt reports when he pulled his sock back there were 2 small puncture wounds and he thinks he was bit by a snake.

## 2019-06-19 NOTE — Discharge Instructions (Signed)
Take the antibiotic as prescribed  Keep the leg elevated as often as possible until symptoms resolve  Monitor the area for worsening redness, pain, drainage, or swelling. If you notice this, return to the ER for wound check and evaluation.

## 2019-06-20 MED ORDER — DOXYCYCLINE HYCLATE 100 MG PO TABS
100.0000 mg | ORAL_TABLET | Freq: Once | ORAL | Status: AC
  Administered 2019-06-20: 100 mg via ORAL
  Filled 2019-06-20: qty 1

## 2019-06-20 MED ORDER — IBUPROFEN 600 MG PO TABS
600.0000 mg | ORAL_TABLET | Freq: Once | ORAL | Status: AC
  Administered 2019-06-20: 600 mg via ORAL
  Filled 2019-06-20: qty 1

## 2019-06-20 NOTE — ED Provider Notes (Signed)
I assumed care of the patient from Dr. Quentin Cornwall at 11:00 PM.  Patient's pain well controlled swelling has not worsened by his own admission.  Please refer to below picture.  Patient will be discharged home with outpatient follow-up with Dr. Netty Starring.       Gregor Hams, MD 06/20/19 [phone]

## 2019-08-20 ENCOUNTER — Ambulatory Visit: Payer: Medicare Other | Admitting: Dermatology

## 2019-08-20 ENCOUNTER — Other Ambulatory Visit: Payer: Self-pay

## 2019-08-20 DIAGNOSIS — L820 Inflamed seborrheic keratosis: Secondary | ICD-10-CM

## 2019-08-20 DIAGNOSIS — L720 Epidermal cyst: Secondary | ICD-10-CM | POA: Diagnosis not present

## 2019-08-20 DIAGNOSIS — Z85828 Personal history of other malignant neoplasm of skin: Secondary | ICD-10-CM

## 2019-08-20 DIAGNOSIS — Z1283 Encounter for screening for malignant neoplasm of skin: Secondary | ICD-10-CM

## 2019-08-20 DIAGNOSIS — D1800 Hemangioma unspecified site: Secondary | ICD-10-CM

## 2019-08-20 DIAGNOSIS — D229 Melanocytic nevi, unspecified: Secondary | ICD-10-CM

## 2019-08-20 DIAGNOSIS — L814 Other melanin hyperpigmentation: Secondary | ICD-10-CM | POA: Diagnosis not present

## 2019-08-20 DIAGNOSIS — L578 Other skin changes due to chronic exposure to nonionizing radiation: Secondary | ICD-10-CM

## 2019-08-20 DIAGNOSIS — L821 Other seborrheic keratosis: Secondary | ICD-10-CM

## 2019-08-20 NOTE — Patient Instructions (Addendum)

## 2019-08-20 NOTE — Progress Notes (Signed)
Follow-Up Visit   Subjective  Anthony [name] is a 79 y.o. male who presents for the following: TBSE. The patient presents for Total-Body Skin Exam (TBSE) for skin cancer screening and mole check. Patient presents today for annual TBSE, has some concerns on his arms, legs and the left side of his nose. Patient does have a h/o BCC on chest sternum 07/26/18  The following portions of the chart were reviewed this encounter and updated as appropriate:  Tobacco  Allergies  Meds  Problems  Med Hx  Surg Hx  Fam Hx     Review of Systems:  No other skin or systemic complaints except as noted in HPI or Assessment and Plan.  Objective  Well appearing patient in no apparent distress; mood and affect are within normal limits.  A full examination was performed including scalp, head, eyes, ears, nose, lips, neck, chest, axillae, abdomen, back, buttocks, bilateral upper extremities, bilateral lower extremities, hands, feet, fingers, toes, fingernails, and toenails. All findings within normal limits unless otherwise noted below.  Objective  Left Nasal Sidewall: Smooth white papule(s).   Objective  B/L Bicep x 2 (2), Left Thigh - Anterior, Right Knee - Anterior, Upper Back x 2 (2): Erythematous keratotic or waxy stuck-on papule or plaque.   Objective  Left Postauricular Area: 0.5 cm firm SQ nodule    Assessment & Plan  Milia Left Nasal Sidewall  Extraction of Milia Left nasal sidewall  Destruction of lesion - Left Nasal Sidewall Complexity: simple   Destruction method comment:  Extraction Outcome: patient tolerated procedure well with no complications   Post-procedure details: wound care instructions given    Inflamed seborrheic keratosis (6) Right Knee - Anterior; B/L Bicep x 2 (2); Left Thigh - Anterior; Upper Back x 2 (2)  Cryotherapy today Prior to procedure, discussed risks of blister formation, small wound, skin dyspigmentation, or rare scar following cryotherapy.      Destruction of lesion - B/L Bicep x 2, Right Knee - Anterior, Upper Back x 2 Complexity: simple   Destruction method: cryotherapy   Informed consent: discussed and consent obtained   Timeout:  patient name, date of birth, surgical site, and procedure verified Lesion destroyed using liquid nitrogen: Yes   Region frozen until ice ball extended beyond lesion: Yes   Outcome: patient tolerated procedure well with no complications   Post-procedure details: wound care instructions given    Epidermal inclusion cyst Left Postauricular Area  Plan simple excision    Lentigines - Scattered tan macules - Discussed due to sun exposure - Benign, observe - Call for any changes  Seborrheic Keratoses - Stuck-on, waxy, tan-brown papules and plaques  - Discussed benign etiology and prognosis. - Observe - Call for any changes  Melanocytic Nevi - Tan-brown and/or pink-flesh-colored symmetric macules and papules - Benign appearing on exam today - Observation - Call clinic for new or changing moles - Recommend daily use of broad spectrum spf 30+ sunscreen to sun-exposed areas.   Hemangiomas - Red papules - Discussed benign nature - Observe - Call for any changes  Actinic Damage - diffuse scaly erythematous macules with underlying dyspigmentation - Recommend daily broad spectrum sunscreen SPF 30+ to sun-exposed areas, reapply every 2 hours as needed.  - Call for new or changing lesions.  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  - Call if any new or changing lesions are noted between office visits  Skin cancer screening performed today.  Return in about 1 year (around 08/19/2020) for TBSE.  I, Donzetta Kohut, CMA, am acting as scribe for Sarina Ser, MD . Documentation: I have  reviewed the above documentation for accuracy and completeness, and I agree with the above.  Sarina Ser, MD

## 2019-08-27 ENCOUNTER — Encounter: Payer: Self-pay | Admitting: Dermatology

## 2019-09-17 ENCOUNTER — Telehealth: Payer: Self-pay

## 2019-09-17 NOTE — Telephone Encounter (Signed)
Pt called to let Dr Raliegh Ip know he is scheduled for surgery simple excision of a cyst L post auricular  Sept 21 he will not be able to stop his Eliquis, Coreg or ASA. Discussed with pt I will let Dr Raliegh Ip know.

## 2019-09-17 NOTE — Telephone Encounter (Signed)
OK.  That should be fine.  Let him know is OK.

## 2019-09-17 NOTE — Telephone Encounter (Signed)
Discussed with pt fine to cont current med before his surgery appt here next week

## 2019-09-25 ENCOUNTER — Encounter: Payer: Medicare Other | Admitting: Dermatology

## 2019-10-30 ENCOUNTER — Encounter: Payer: Medicare Other | Admitting: Dermatology

## 2020-08-20 ENCOUNTER — Other Ambulatory Visit: Payer: Self-pay

## 2020-08-20 ENCOUNTER — Ambulatory Visit: Payer: Medicare Other | Admitting: Dermatology

## 2020-08-20 DIAGNOSIS — C44519 Basal cell carcinoma of skin of other part of trunk: Secondary | ICD-10-CM

## 2020-08-20 DIAGNOSIS — D1800 Hemangioma unspecified site: Secondary | ICD-10-CM | POA: Diagnosis not present

## 2020-08-20 DIAGNOSIS — L578 Other skin changes due to chronic exposure to nonionizing radiation: Secondary | ICD-10-CM

## 2020-08-20 DIAGNOSIS — Z1283 Encounter for screening for malignant neoplasm of skin: Secondary | ICD-10-CM | POA: Diagnosis not present

## 2020-08-20 DIAGNOSIS — Z85828 Personal history of other malignant neoplasm of skin: Secondary | ICD-10-CM | POA: Diagnosis not present

## 2020-08-20 DIAGNOSIS — D229 Melanocytic nevi, unspecified: Secondary | ICD-10-CM

## 2020-08-20 DIAGNOSIS — L814 Other melanin hyperpigmentation: Secondary | ICD-10-CM

## 2020-08-20 DIAGNOSIS — L821 Other seborrheic keratosis: Secondary | ICD-10-CM

## 2020-08-20 DIAGNOSIS — L820 Inflamed seborrheic keratosis: Secondary | ICD-10-CM | POA: Diagnosis not present

## 2020-08-20 DIAGNOSIS — L570 Actinic keratosis: Secondary | ICD-10-CM | POA: Diagnosis not present

## 2020-08-20 DIAGNOSIS — D485 Neoplasm of uncertain behavior of skin: Secondary | ICD-10-CM

## 2020-08-20 NOTE — Patient Instructions (Addendum)
Cryotherapy Aftercare  Wash gently with soap and water everyday.   Apply Vaseline and Band-Aid daily until healed.    Wound Care Instructions  Cleanse wound gently with soap and water once a day then pat dry with clean gauze. Apply a thing coat of Petrolatum (petroleum jelly, "Vaseline") over the wound (unless you have an allergy to this). We recommend that you use a new, sterile tube of Vaseline. Do not pick or remove scabs. Do not remove the yellow or white "healing tissue" from the base of the wound.  Cover the wound with fresh, clean, nonstick gauze and secure with paper tape. You may use Band-Aids in place of gauze and tape if the would is small enough, but would recommend trimming much of the tape off as there is often too much. Sometimes Band-Aids can irritate the skin.  You should call the office for your biopsy report after 1 week if you have not already been contacted.  If you experience any problems, such as abnormal amounts of bleeding, swelling, significant bruising, significant pain, or evidence of infection, please call the office immediately.  FOR ADULT SURGERY PATIENTS: If you need something for pain relief you may take 1 extra strength Tylenol (acetaminophen) AND 2 Ibuprofen (200mg each) together every 4 hours as needed for pain. (do not take these if you are allergic to them or if you have a reason you should not take them.) Typically, you may only need pain medication for 1 to 3 days.     If you have any questions or concerns for your doctor, please call our main line at [phone] and press option 4 to reach your doctor's medical assistant. If no one answers, please leave a voicemail as directed and we will return your call as soon as possible. Messages left after 4 pm will be answered the following business day.   You may also send us a message via MyChart. We typically respond to MyChart messages within 1-2 business days.  For prescription refills, please ask your  pharmacy to contact our office. Our fax number is [phone].  If you have an urgent issue when the clinic is closed that cannot wait until the next business day, you can page your doctor at the number below.    Please note that while we do our best to be available for urgent issues outside of office hours, we are not available 24/7.   If you have an urgent issue and are unable to reach us, you may choose to seek medical care at your doctor's office, retail clinic, urgent care center, or emergency room.  If you have a medical emergency, please immediately call 911 or go to the emergency department.  Pager Numbers  - Dr. Kowalski: [phone]  - Dr. Moye: [phone]  - Dr. Stewart: [phone]  In the event of inclement weather, please call our main line at [phone] for an update on the status of any delays or closures.  Dermatology Medication Tips: Please keep the boxes that topical medications come in in order to help keep track of the instructions about where and how to use these. Pharmacies typically print the medication instructions only on the boxes and not directly on the medication tubes.   If your medication is too expensive, please contact our office at [phone] option 4 or send us a message through MyChart.   We are unable to tell what your co-pay for medications will be in advance as this is different depending on your insurance coverage.   However, we may be able to find a substitute medication at lower cost or fill out paperwork to get insurance to cover a needed medication.   If a prior authorization is required to get your medication covered by your insurance company, please allow us 1-2 business days to complete this process.  Drug prices often vary depending on where the prescription is filled and some pharmacies may offer cheaper prices.  The website www.goodrx.com contains coupons for medications through different pharmacies. The prices here do not  account for what the cost may be with help from insurance (it may be cheaper with your insurance), but the website can give you the price if you did not use any insurance.  - You can print the associated coupon and take it with your prescription to the pharmacy.  - You may also stop by our office during regular business hours and pick up a GoodRx coupon card.  - If you need your prescription sent electronically to a different pharmacy, notify our office through [facility] MyChart or by phone at [phone] option 4.  

## 2020-08-20 NOTE — Progress Notes (Signed)
Follow-Up Visit   Subjective  Anthony [name] is a 80 y.o. male who presents for the following: Annual Exam (History of BCC - TBSE today). The patient presents for Total-Body Skin Exam (TBSE) for skin cancer screening and mole check.  The following portions of the chart were reviewed this encounter and updated as appropriate:   Tobacco  Allergies  Meds  Problems  Med Hx  Surg Hx  Fam Hx     Review of Systems:  No other skin or systemic complaints except as noted in HPI or Assessment and Plan.  Objective  Well appearing patient in no apparent distress; mood and affect are within normal limits.  A full examination was performed including scalp, head, eyes, ears, nose, lips, neck, chest, axillae, abdomen, back, buttocks, bilateral upper extremities, bilateral lower extremities, hands, feet, fingers, toes, fingernails, and toenails. All findings within normal limits unless otherwise noted below.  Ant scalp Erythematous thin papules/macules with gritty scale.   Rigth infraorbital x 1, left chest x 1 - Total - 2 (2) Erythematous keratotic or waxy stuck-on papule or plaque.   Left chest parasternal 1.1 cm crusted pink patch        Assessment & Plan   History of Basal Cell Carcinoma of the Skin - No evidence of recurrence today - Recommend regular full body skin exams - Recommend daily broad spectrum sunscreen SPF 30+ to sun-exposed areas, reapply every 2 hours as needed.  - Call if any new or changing lesions are noted between office visits  Lentigines - Scattered tan macules - Due to sun exposure - Benign-appering, observe - Recommend daily broad spectrum sunscreen SPF 30+ to sun-exposed areas, reapply every 2 hours as needed. - Call for any changes  Seborrheic Keratoses - Stuck-on, waxy, tan-brown papules and/or plaques  - Benign-appearing - Discussed benign etiology and prognosis. - Observe - Call for any changes - Discussed treatment with LN2. Advised  patient fee is $60 for 1st lesion and $15 each additional or $350 for greater than 20.  Melanocytic Nevi - Tan-brown and/or pink-flesh-colored symmetric macules and papules - Benign appearing on exam today - Observation - Call clinic for new or changing moles - Recommend daily use of broad spectrum spf 30+ sunscreen to sun-exposed areas.   Hemangiomas - Red papules - Discussed benign nature - Observe - Call for any changes  Actinic Damage - Chronic condition, secondary to cumulative UV/sun exposure - diffuse scaly erythematous macules with underlying dyspigmentation - Recommend daily broad spectrum sunscreen SPF 30+ to sun-exposed areas, reapply every 2 hours as needed.  - Staying in the shade or wearing long sleeves, sun glasses (UVA+UVB protection) and wide brim hats (4-inch brim around the entire circumference of the hat) are also recommended for sun protection.  - Call for new or changing lesions.  Skin cancer screening performed today.  AK (actinic keratosis) Ant scalp  Destruction of lesion - Ant scalp Complexity: simple   Destruction method: cryotherapy   Informed consent: discussed and consent obtained   Timeout:  patient name, date of birth, surgical site, and procedure verified Lesion destroyed using liquid nitrogen: Yes   Region frozen until ice ball extended beyond lesion: Yes   Outcome: patient tolerated procedure well with no complications   Post-procedure details: wound care instructions given    Inflamed seborrheic keratosis Rigth infraorbital x 1, left chest x 1 - Total - 2  Destruction of lesion - Rigth infraorbital x 1, left chest x 1 - Total -  2 Complexity: simple   Destruction method: cryotherapy   Informed consent: discussed and consent obtained   Timeout:  patient name, date of birth, surgical site, and procedure verified Lesion destroyed using liquid nitrogen: Yes   Region frozen until ice ball extended beyond lesion: Yes   Outcome: patient  tolerated procedure well with no complications   Post-procedure details: wound care instructions given    Neoplasm of uncertain behavior of skin Left chest parasternal  Skin / nail biopsy Type of biopsy: tangential   Informed consent: discussed and consent obtained   Patient was prepped and draped in usual sterile fashion: Area prepped with alcohol. Anesthesia: the lesion was anesthetized in a standard fashion   Anesthetic:  1% lidocaine w/ epinephrine 1-100,000 buffered w/ 8.4% NaHCO3 Instrument used: flexible razor blade   Hemostasis achieved with: pressure, aluminum chloride and electrodesiccation   Outcome: patient tolerated procedure well   Post-procedure details: wound care instructions given   Post-procedure details comment:  Ointment and small bandage applied  Specimen 1 - Surgical pathology Differential Diagnosis: BCC vs other Check Margins: No  Skin cancer screening  Follow-up as scheduled or no later than 6 to 12 months  I, Hollie Dean, CMA, am acting as scribe for Sarina Ser, MD . Documentation: I have reviewed the above documentation for accuracy and completeness, and I agree with the above.  Sarina Ser, MD

## 2020-08-24 ENCOUNTER — Encounter: Payer: Self-pay | Admitting: Dermatology

## 2020-09-02 ENCOUNTER — Telehealth: Payer: Self-pay

## 2020-09-02 NOTE — Telephone Encounter (Signed)
Spoke with pt and informed him of results and scheduled for [facility]. He had no concerns.

## 2020-09-25 ENCOUNTER — Encounter: Payer: Self-pay | Admitting: Dermatology

## 2020-09-25 ENCOUNTER — Ambulatory Visit: Payer: Medicare Other | Admitting: Dermatology

## 2020-09-25 ENCOUNTER — Other Ambulatory Visit: Payer: Self-pay

## 2020-09-25 DIAGNOSIS — L578 Other skin changes due to chronic exposure to nonionizing radiation: Secondary | ICD-10-CM

## 2020-09-25 DIAGNOSIS — C44519 Basal cell carcinoma of skin of other part of trunk: Secondary | ICD-10-CM | POA: Diagnosis not present

## 2020-09-25 NOTE — Patient Instructions (Signed)
Wound Care Instructions  Cleanse wound gently with soap and water once a day then pat dry with clean gauze. Apply a thing coat of Petrolatum (petroleum jelly, "Vaseline") over the wound (unless you have an allergy to this). We recommend that you use a new, sterile tube of Vaseline. Do not pick or remove scabs. Do not remove the yellow or white "healing tissue" from the base of the wound.  Cover the wound with fresh, clean, nonstick gauze and secure with paper tape. You may use Band-Aids in place of gauze and tape if the would is small enough, but would recommend trimming much of the tape off as there is often too much. Sometimes Band-Aids can irritate the skin.  You should call the office for your biopsy report after 1 week if you have not already been contacted.  If you experience any problems, such as abnormal amounts of bleeding, swelling, significant bruising, significant pain, or evidence of infection, please call the office immediately.  FOR ADULT SURGERY PATIENTS: If you need something for pain relief you may take 1 extra strength Tylenol (acetaminophen) AND 2 Ibuprofen (200mg each) together every 4 hours as needed for pain. (do not take these if you are allergic to them or if you have a reason you should not take them.) Typically, you may only need pain medication for 1 to 3 days.   If you have any questions or concerns for your doctor, please call our main line at [phone] and press option 4 to reach your doctor's medical assistant. If no one answers, please leave a voicemail as directed and we will return your call as soon as possible. Messages left after 4 pm will be answered the following business day.   You may also send us a message via MyChart. We typically respond to MyChart messages within 1-2 business days.  For prescription refills, please ask your pharmacy to contact our office. Our fax number is [phone].  If you have an urgent issue when the clinic is closed that  cannot wait until the next business day, you can page your doctor at the number below.    Please note that while we do our best to be available for urgent issues outside of office hours, we are not available 24/7.   If you have an urgent issue and are unable to reach us, you may choose to seek medical care at your doctor's office, retail clinic, urgent care center, or emergency room.  If you have a medical emergency, please immediately call 911 or go to the emergency department.  Pager Numbers  - Dr. Kowalski: [phone]  - Dr. Moye: [phone]  - Dr. Stewart: [phone]  In the event of inclement weather, please call our main line at [phone] for an update on the status of any delays or closures.  Dermatology Medication Tips: Please keep the boxes that topical medications come in in order to help keep track of the instructions about where and how to use these. Pharmacies typically print the medication instructions only on the boxes and not directly on the medication tubes.   If your medication is too expensive, please contact our office at [phone] option 4 or send us a message through MyChart.   We are unable to tell what your co-pay for medications will be in advance as this is different depending on your insurance coverage. However, we may be able to find a substitute medication at lower cost or fill out paperwork to get insurance to cover a needed   medication.   If a prior authorization is required to get your medication covered by your insurance company, please allow us 1-2 business days to complete this process.  Drug prices often vary depending on where the prescription is filled and some pharmacies may offer cheaper prices.  The website www.goodrx.com contains coupons for medications through different pharmacies. The prices here do not account for what the cost may be with help from insurance (it may be cheaper with your insurance), but the website can give you the  price if you did not use any insurance.  - You can print the associated coupon and take it with your prescription to the pharmacy.  - You may also stop by our office during regular business hours and pick up a GoodRx coupon card.  - If you need your prescription sent electronically to a different pharmacy, notify our office through [facility] MyChart or by phone at [phone] option 4.   

## 2020-09-25 NOTE — Progress Notes (Signed)
   Follow-Up Visit   Subjective  Anthony [name] is a 80 y.o. male who presents for the following: Basal Cell Carcinoma (Biopsy proven BCC of left chest parasternal - EDC today).  The following portions of the chart were reviewed this encounter and updated as appropriate:   Tobacco  Allergies  Meds  Problems  Med Hx  Surg Hx  Fam Hx     Review of Systems:  No other skin or systemic complaints except as noted in HPI or Assessment and Plan.  Objective  Well appearing patient in no apparent distress; mood and affect are within normal limits.  A focused examination was performed including chest. Relevant physical exam findings are noted in the Assessment and Plan.  Left chest parasternal Healing biopsy site   Assessment & Plan   Actinic Damage - chronic, secondary to cumulative UV radiation exposure/sun exposure over time - diffuse scaly erythematous macules with underlying dyspigmentation - Recommend daily broad spectrum sunscreen SPF 30+ to sun-exposed areas, reapply every 2 hours as needed.  - Recommend staying in the shade or wearing long sleeves, sun glasses (UVA+UVB protection) and wide brim hats (4-inch brim around the entire circumference of the hat). - Call for new or changing lesions.  Basal cell carcinoma (BCC) of skin of other part of torso Left chest parasternal  Destruction of lesion Complexity: extensive   Destruction method: electrodesiccation and curettage   Informed consent: discussed and consent obtained   Timeout:  patient name, date of birth, surgical site, and procedure verified Procedure prep:  Patient was prepped and draped in usual sterile fashion Prep type:  Isopropyl alcohol Anesthesia: the lesion was anesthetized in a standard fashion   Anesthetic:  1% lidocaine w/ epinephrine 1-100,000 buffered w/ 8.4% NaHCO3 Curettage performed in three different directions: Yes   Electrodesiccation performed over the curetted area: Yes   Final wound  size (cm):  2.1 Hemostasis achieved with:  pressure and aluminum chloride Outcome: patient tolerated procedure well with no complications   Post-procedure details: sterile dressing applied and wound care instructions given   Dressing type: bandage and petrolatum    Return in about 7 months (around 04/25/2021).  I, Ashok Cordia, CMA, am acting as scribe for Sarina Ser, MD . Documentation: I have reviewed the above documentation for accuracy and completeness, and I agree with the above.  Sarina Ser, MD

## 2020-09-30 ENCOUNTER — Encounter: Payer: Self-pay | Admitting: Dermatology

## 2021-04-06 ENCOUNTER — Ambulatory Visit: Payer: Medicare Other | Admitting: Dermatology

## 2021-08-05 ENCOUNTER — Encounter: Payer: Self-pay | Admitting: Dermatology

## 2021-08-05 ENCOUNTER — Ambulatory Visit: Payer: Medicare Other | Admitting: Dermatology

## 2021-08-05 DIAGNOSIS — L578 Other skin changes due to chronic exposure to nonionizing radiation: Secondary | ICD-10-CM

## 2021-08-05 DIAGNOSIS — D225 Melanocytic nevi of trunk: Secondary | ICD-10-CM | POA: Diagnosis not present

## 2021-08-05 DIAGNOSIS — D1800 Hemangioma unspecified site: Secondary | ICD-10-CM

## 2021-08-05 DIAGNOSIS — Z85828 Personal history of other malignant neoplasm of skin: Secondary | ICD-10-CM

## 2021-08-05 DIAGNOSIS — L720 Epidermal cyst: Secondary | ICD-10-CM

## 2021-08-05 DIAGNOSIS — L814 Other melanin hyperpigmentation: Secondary | ICD-10-CM

## 2021-08-05 DIAGNOSIS — Z1283 Encounter for screening for malignant neoplasm of skin: Secondary | ICD-10-CM

## 2021-08-05 DIAGNOSIS — L821 Other seborrheic keratosis: Secondary | ICD-10-CM

## 2021-08-05 DIAGNOSIS — L820 Inflamed seborrheic keratosis: Secondary | ICD-10-CM

## 2021-08-05 DIAGNOSIS — D229 Melanocytic nevi, unspecified: Secondary | ICD-10-CM

## 2021-08-05 NOTE — Patient Instructions (Addendum)
Cryotherapy Aftercare  Wash gently with soap and water everyday.   Apply Vaseline and Band-Aid daily until healed.     Due to recent changes in healthcare laws, you may see results of your pathology and/or laboratory studies on MyChart before the doctors have had a chance to review them. We understand that in some cases there may be results that are confusing or concerning to you. Please understand that not all results are received at the same time and often the doctors may need to interpret multiple results in order to provide you with the best plan of care or course of treatment. Therefore, we ask that you please give us 2 business days to thoroughly review all your results before contacting the office for clarification. Should we see a critical lab result, you will be contacted sooner.   If You Need Anything After Your Visit  If you have any questions or concerns for your doctor, please call our main line at [phone] and press option 4 to reach your doctor's medical assistant. If no one answers, please leave a voicemail as directed and we will return your call as soon as possible. Messages left after 4 pm will be answered the following business day.   You may also send us a message via MyChart. We typically respond to MyChart messages within 1-2 business days.  For prescription refills, please ask your pharmacy to contact our office. Our fax number is [phone].  If you have an urgent issue when the clinic is closed that cannot wait until the next business day, you can page your doctor at the number below.    Please note that while we do our best to be available for urgent issues outside of office hours, we are not available 24/7.   If you have an urgent issue and are unable to reach us, you may choose to seek medical care at your doctor's office, retail clinic, urgent care center, or emergency room.  If you have a medical emergency, please immediately call 911 or go to the  emergency department.  Pager Numbers  - Dr. Kowalski: [phone]  - Dr. Moye: [phone]  - Dr. Stewart: [phone]  In the event of inclement weather, please call our main line at [phone] for an update on the status of any delays or closures.  Dermatology Medication Tips: Please keep the boxes that topical medications come in in order to help keep track of the instructions about where and how to use these. Pharmacies typically print the medication instructions only on the boxes and not directly on the medication tubes.   If your medication is too expensive, please contact our office at [phone] option 4 or send us a message through MyChart.   We are unable to tell what your co-pay for medications will be in advance as this is different depending on your insurance coverage. However, we may be able to find a substitute medication at lower cost or fill out paperwork to get insurance to cover a needed medication.   If a prior authorization is required to get your medication covered by your insurance company, please allow us 1-2 business days to complete this process.  Drug prices often vary depending on where the prescription is filled and some pharmacies may offer cheaper prices.  The website www.goodrx.com contains coupons for medications through different pharmacies. The prices here do not account for what the cost may be with help from insurance (it may be cheaper with your insurance), but the website can   give you the price if you did not use any insurance.  - You can print the associated coupon and take it with your prescription to the pharmacy.  - You may also stop by our office during regular business hours and pick up a GoodRx coupon card.  - If you need your prescription sent electronically to a different pharmacy, notify our office through [facility] MyChart or by phone at [phone] option 4.     Si Usted Necesita Algo Despus de Su Visita  Tambin puede  enviarnos un mensaje a travs de MyChart. Por lo general respondemos a los mensajes de MyChart en el transcurso de 1 a 2 das hbiles.  Para renovar recetas, por favor pida a su farmacia que se ponga en contacto con nuestra oficina. Nuestro nmero de fax es el [phone].  Si tiene un asunto urgente cuando la clnica est cerrada y que no puede esperar hasta el siguiente da hbil, puede llamar/localizar a su doctor(a) al nmero que aparece a continuacin.   Por favor, tenga en cuenta que aunque hacemos todo lo posible para estar disponibles para asuntos urgentes fuera del horario de oficina, no estamos disponibles las 24 horas del da, los 7 das de la semana.   Si tiene un problema urgente y no puede comunicarse con nosotros, puede optar por buscar atencin mdica  en el consultorio de su doctor(a), en una clnica privada, en un centro de atencin urgente o en una sala de emergencias.  Si tiene una emergencia mdica, por favor llame inmediatamente al 911 o vaya a la sala de emergencias.  Nmeros de bper  - Dr. Kowalski: [phone]  - Dra. Moye: [phone]  - Dra. Stewart: [phone]  En caso de inclemencias del tiempo, por favor llame a nuestra lnea principal al [phone] para una actualizacin sobre el estado de cualquier retraso o cierre.  Consejos para la medicacin en dermatologa: Por favor, guarde las cajas en las que vienen los medicamentos de uso tpico para ayudarle a seguir las instrucciones sobre dnde y cmo usarlos. Las farmacias generalmente imprimen las instrucciones del medicamento slo en las cajas y no directamente en los tubos del medicamento.   Si su medicamento es muy caro, por favor, pngase en contacto con nuestra oficina llamando al [phone] y presione la opcin 4 o envenos un mensaje a travs de MyChart.   No podemos decirle cul ser su copago por los medicamentos por adelantado ya que esto es diferente dependiendo de la cobertura de su seguro.  Sin embargo, es posible que podamos encontrar un medicamento sustituto a menor costo o llenar un formulario para que el seguro cubra el medicamento que se considera necesario.   Si se requiere una autorizacin previa para que su compaa de seguros cubra su medicamento, por favor permtanos de 1 a 2 das hbiles para completar este proceso.  Los precios de los medicamentos varan con frecuencia dependiendo del lugar de dnde se surte la receta y alguna farmacias pueden ofrecer precios ms baratos.  El sitio web www.goodrx.com tiene cupones para medicamentos de diferentes farmacias. Los precios aqu no tienen en cuenta lo que podra costar con la ayuda del seguro (puede ser ms barato con su seguro), pero el sitio web puede darle el precio si no utiliz ningn seguro.  - Puede imprimir el cupn correspondiente y llevarlo con su receta a la farmacia.  - Tambin puede pasar por nuestra oficina durante el horario de atencin regular y recoger una tarjeta de cupones de GoodRx.  -   Si necesita que su receta se enve electrnicamente a una farmacia diferente, informe a nuestra oficina a travs de MyChart de [facility] o por telfono llamando al [phone] y presione la opcin 4.  

## 2021-08-05 NOTE — Progress Notes (Deleted)
   Follow-Up Visit   Subjective  Anthony [name] is a 81 y.o. male who presents for the following: Annual Exam (Mole check). Hx of BCC The patient presents for Total-Body Skin Exam (TBSE) for skin cancer screening and mole check.  The patient has spots, moles and lesions to be evaluated, some may be new or changing and the patient has concerns that these could be cancer.    The following portions of the chart were reviewed this encounter and updated as appropriate:       Review of Systems:  No other skin or systemic complaints except as noted in HPI or Assessment and Plan.  Objective  Well appearing patient in no apparent distress; mood and affect are within normal limits.  A full examination was performed including scalp, head, eyes, ears, nose, lips, neck, chest, axillae, abdomen, back, buttocks, bilateral upper extremities, bilateral lower extremities, hands, feet, fingers, toes, fingernails, and toenails. All findings within normal limits unless otherwise noted below.  scalp, forehead Stuck-on, waxy, tan-brown papules --Discussed benign etiology and prognosis.   right posterior neck 1.1 cm Subcutaneous nodule.   right waist Hyperpigmentation     Assessment & Plan   Lentigines - Scattered tan macules - Due to sun exposure - Benign-appearing, observe - Recommend daily broad spectrum sunscreen SPF 30+ to sun-exposed areas, reapply every 2 hours as needed. - Call for any changes  Seborrheic Keratoses - Stuck-on, waxy, tan-brown papules and/or plaques  - Benign-appearing - Discussed benign etiology and prognosis. - Observe - Call for any changes  Melanocytic Nevi - Tan-brown and/or pink-flesh-colored symmetric macules and papules - Benign appearing on exam today - Observation - Call clinic for new or changing moles - Recommend daily use of broad spectrum spf 30+ sunscreen to sun-exposed areas.   Hemangiomas - Red papules - Discussed benign nature -  Observe - Call for any changes  Actinic Damage - Chronic condition, secondary to cumulative UV/sun exposure - diffuse scaly erythematous macules with underlying dyspigmentation - Recommend daily broad spectrum sunscreen SPF 30+ to sun-exposed areas, reapply every 2 hours as needed.  - Staying in the shade or wearing long sleeves, sun glasses (UVA+UVB protection) and wide brim hats (4-inch brim around the entire circumference of the hat) are also recommended for sun protection.  - Call for new or changing lesions.  History of Basal Cell Carcinoma of the Skin Chest sternum Left chest parasternal  - No evidence of recurrence today - Recommend regular full body skin exams - Recommend daily broad spectrum sunscreen SPF 30+ to sun-exposed areas, reapply every 2 hours as needed.  - Call if any new or changing lesions are noted between office visits   Skin cancer screening performed today.   Return in about 1 year (around 08/06/2022) for TBSE, hx of BCC .  IMarye Round, CMA, am acting as scribe for Sarina Ser, MD .

## 2021-08-05 NOTE — Progress Notes (Signed)
Follow-Up Visit   Subjective  Anthony [name] is a 81 y.o. male who presents for the following: Annual Exam (Mole check). Hx of BCC The patient presents for Total-Body Skin Exam (TBSE) for skin cancer screening and mole check.  The patient has spots, moles and lesions to be evaluated, some may be new or changing and the patient has concerns that these could be cancer.   The following portions of the chart were reviewed this encounter and updated as appropriate:   Tobacco  Allergies  Meds  Problems  Med Hx  Surg Hx  Fam Hx     Review of Systems:  No other skin or systemic complaints except as noted in HPI or Assessment and Plan.  Objective  Well appearing patient in no apparent distress; mood and affect are within normal limits.  A full examination was performed including scalp, head, eyes, ears, nose, lips, neck, chest, axillae, abdomen, back, buttocks, bilateral upper extremities, bilateral lower extremities, hands, feet, fingers, toes, fingernails, and toenails. All findings within normal limits unless otherwise noted below.  scalp, forehead (3) Stuck-on, waxy, tan-brown papules --Discussed benign etiology and prognosis.   right posterior neck 1.1 cm Subcutaneous nodule.   right waist Hyperpigmentation    Assessment & Plan  Inflamed seborrheic keratosis (3) scalp, forehead  Symptomatic, irritating, patient would like treated.   Destruction of lesion - scalp, forehead Complexity: simple   Destruction method: cryotherapy   Informed consent: discussed and consent obtained   Timeout:  patient name, date of birth, surgical site, and procedure verified Lesion destroyed using liquid nitrogen: Yes   Region frozen until ice ball extended beyond lesion: Yes   Outcome: patient tolerated procedure well with no complications   Post-procedure details: wound care instructions given    Epidermal inclusion cyst right posterior neck  Benign-appearing. Exam most  consistent with an epidermal inclusion cyst. Discussed that a cyst is a benign growth that can grow over time and sometimes get irritated or inflamed. Recommend observation if it is not bothersome. Discussed option of surgical excision to remove it if it is growing, symptomatic, or other changes noted. Please call for new or changing lesions so they can be evaluated.    Becker's nevus right waist  Benign-appearing.  Observation.  Call clinic for new or changing lesions.  Recommend daily use of broad spectrum spf 30+ sunscreen to sun-exposed areas.    Lentigines - Scattered tan macules - Due to sun exposure - Benign-appearing, observe - Recommend daily broad spectrum sunscreen SPF 30+ to sun-exposed areas, reapply every 2 hours as needed. - Call for any changes  Seborrheic Keratoses - Stuck-on, waxy, tan-brown papules and/or plaques  - Benign-appearing - Discussed benign etiology and prognosis. - Observe - Call for any changes  Melanocytic Nevi - Tan-brown and/or pink-flesh-colored symmetric macules and papules - Benign appearing on exam today - Observation - Call clinic for new or changing moles - Recommend daily use of broad spectrum spf 30+ sunscreen to sun-exposed areas.   Hemangiomas - Red papules - Discussed benign nature - Observe - Call for any changes  Actinic Damage - Chronic condition, secondary to cumulative UV/sun exposure - diffuse scaly erythematous macules with underlying dyspigmentation - Recommend daily broad spectrum sunscreen SPF 30+ to sun-exposed areas, reapply every 2 hours as needed.  - Staying in the shade or wearing long sleeves, sun glasses (UVA+UVB protection) and wide brim hats (4-inch brim around the entire circumference of the hat) are also recommended for sun protection.  -  Call for new or changing lesions.  History of Basal Cell Carcinoma of the Skin Chest sternum 2020 Left chest parasternal 2022 - No evidence of recurrence today -  Recommend regular full body skin exams - Recommend daily broad spectrum sunscreen SPF 30+ to sun-exposed areas, reapply every 2 hours as needed.  - Call if any new or changing lesions are noted between office visits   Skin cancer screening performed today.   Return in about 1 year (around 08/06/2022) for TBSE, hx of BCC .  IMarye Round, CMA, am acting as scribe for Sarina Ser, MD .  Documentation: I have reviewed the above documentation for accuracy and completeness, and I agree with the above.  Sarina Ser, MD

## 2021-09-08 ENCOUNTER — Other Ambulatory Visit: Payer: Self-pay | Admitting: Podiatry

## 2021-09-09 ENCOUNTER — Encounter
Admission: RE | Admit: 2021-09-09 | Discharge: 2021-09-09 | Disposition: A | Discharge status: Home / Self Care | Payer: Medicare Other | Source: Ambulatory Visit | Attending: Podiatry | Admitting: Podiatry

## 2021-09-09 VITALS — Ht 68.0 in | Wt 198.0 lb

## 2021-09-09 DIAGNOSIS — Z01818 Encounter for other preprocedural examination: Secondary | ICD-10-CM | POA: Diagnosis not present

## 2021-09-09 DIAGNOSIS — Z01812 Encounter for preprocedural laboratory examination: Secondary | ICD-10-CM

## 2021-09-09 DIAGNOSIS — I10 Essential (primary) hypertension: Secondary | ICD-10-CM

## 2021-09-09 LAB — BASIC METABOLIC PANEL
Anion gap: 10 (ref 5–15)
BUN: 33 mg/dL — ABNORMAL HIGH (ref 8–23)
CO2: 27 mmol/L (ref 22–32)
Calcium: 9.5 mg/dL (ref 8.9–10.3)
Chloride: 102 mmol/L (ref 98–111)
Creatinine, Ser: 1.33 mg/dL — ABNORMAL HIGH (ref 0.61–1.24)
GFR, Estimated: 54 mL/min — ABNORMAL LOW (ref >60)
Glucose, Bld: 144 mg/dL — ABNORMAL HIGH (ref 70–99)
Potassium: 4.1 mmol/L (ref 3.5–5.1)
Sodium: 139 mmol/L (ref 135–145)

## 2021-09-09 NOTE — Patient Instructions (Addendum)
Your procedure is scheduled on: Friday September 18, 2021. Report to Day Surgery inside [facility] 2nd floor, stop by registration desk before getting on elevator.  To find out your arrival time please call [phone] between 1PM - 3PM on Thursday September 17, 2021.  Remember: Instructions that are not followed completely may result in serious medical risk,  up to and including death, or upon the discretion of your surgeon and anesthesiologist your  surgery may need to be rescheduled.     _X__ 1. Do not eat food after midnight the night before your procedure.                 No chewing gum or hard candies. You may drink clear liquids up to 2 hours                 before you are scheduled to arrive for your surgery- DO not drink clear                 liquids within 2 hours of the start of your surgery.                 Clear Liquids include:  water, apple juice without pulp, clear Gatorade, G2 or                  Gatorade Zero (avoid Red/Purple/Blue), Black Coffee or Tea (Do not add                 anything to coffee or tea).  __X__2.   Complete the "Ensure Clear Pre-surgery Clear Carbohydrate Drink" provided to you, 2 hours before arrival. **If you are diabetic you will be provided with an alternative drink, Gatorade Zero or G2.  __X__3.  On the morning of surgery brush your teeth with toothpaste and water, you                may rinse your mouth with mouthwash if you wish.  Do not swallow any toothpaste or mouthwash.     _X__ 4.  No Alcohol for 24 hours before or after surgery.   _X__ 5.  Do Not Smoke or use e-cigarettes For 24 Hours Prior to Your Surgery.                 Do not use any chewable tobacco products for at least 6 hours prior to                 Surgery.  _X__  6.  Do not use any recreational drugs (marijuana, cocaine, heroin, ecstasy, MDMA or other)                For at least one week prior to your surgery.  Combination of these drugs with  anesthesia                May have life threatening results.  ____  7.  Bring all medications with you on the day of surgery if instructed.   __X__8.  Notify your doctor if there is any change in your medical condition      (cold, fever, infections).     Do not wear jewelry, make-up, hairpins, clips or nail polish. Do not wear lotions, powders, or perfumes. You may wear deodorant. Do not shave 48 hours prior to surgery. Men may shave face and neck. Do not bring valuables to the hospital.    [facility] is not responsible for any belongings or valuables.  Contacts,  dentures or bridgework may not be worn into surgery. Leave your suitcase in the car. After surgery it may be brought to your room. For patients admitted to the hospital, discharge time is determined by your treatment team.   Patients discharged the day of surgery will not be allowed to drive home.   Make arrangements for someone to be with you for the first 24 hours of your Same Day Discharge.   __X__ Take these medicines the morning of surgery with A SIP OF WATER:    1. carvedilol (COREG) 12.5 MG tablet  2. methimazole (TAPAZOLE) 5 MG  3. simvastatin (ZOCOR) 20 MG tablet  4.  5.  6.  ____ Fleet Enema (as directed)   __X__ Use CHG Soap (or wipes) as directed  ____ Use Benzoyl Peroxide Gel as instructed  ____ Use inhalers on the day of surgery  ____ Stop metformin 2 days prior to surgery    ____ Take 1/2 of usual insulin dose the night before surgery. No insulin the morning          of surgery.   ___X_ Stop apixaban (ELIQUIS) 5 MG and  aspirin 81 mg as instructed by your doctor before your surgery.   __X__ One Week prior to surgery- Stop Anti-inflammatories such as Ibuprofen, Aleve, Advil, Motrin, meloxicam (MOBIC), diclofenac, etodolac, ketorolac, Toradol, Daypro, piroxicam, Goody's or BC powders. OK TO USE TYLENOL IF NEEDED   __X__ Stop supplements until after surgery.    ____ Bring C-Pap to the  hospital.    If you have any questions regarding your pre-procedure instructions,  Please call Pre-admit Testing at [phone]

## 2021-09-10 ENCOUNTER — Encounter: Payer: Self-pay | Admitting: Podiatry

## 2021-09-14 ENCOUNTER — Encounter: Payer: Self-pay | Admitting: Podiatry

## 2021-09-14 NOTE — Progress Notes (Signed)
Perioperative Services  Pre-Admission/Anesthesia Testing Clinical Review  Date: 09/15/21  Patient Demographics:  Name: Anthony [name] DOB:   02/22/1940 MRN:   [id number]  Planned Surgical Procedure(s):    Case: [id number] Date/Time: 09/18/21 1045   Procedure: TAILOR'S BUNION (Left)   Anesthesia type: Choice   Pre-op diagnosis: M21.622 - Tailor's bunion of left foot   Location: [facility] OR ROOM 01 / [place] [facility]S FOR ANEST[facility]ESIA GROUP   Surgeons: Samara Deist, DPM   NOTE: Available PAT nursing documentation and vital signs have been reviewed. Clinical nursing staff has updated patient's PMH/PSHx, current medication list, and drug allergies/intolerances to ensure comprehensive history available to assist in medical decision making as it pertains to the aforementioned surgical procedure and anticipated anesthetic course. Extensive review of available clinical information performed. Anthony [name] PMH and PSHx updated with any diagnoses/procedures that  may have been inadvertently omitted during his intake with the pre-admission testing department's nursing staff.  Clinical Discussion:  Anthony [name] is a 81 y.o. male who is submitted for pre-surgical anesthesia review and clearance prior to him undergoing the above procedure. Patient is a Former Smoker (quit 10/2015). Pertinent PMH includes: CAD, PAF, cardiomyopathy, CVA, HTN, HLD, hypothyroidism, toxic nodular goiter, traumatic SAH, vasovagal syncope.    Patient is followed by cardiology Saralyn Pilar, MD). He was last seen in the cardiology clinic on 08/03/2021; notes reviewed.  At the time of his clinic visit, patient doing well overall from a cardiovascular perspective.  He denied any episodes of episodes of chest pain, short of breath, PND, orthopnea, palpitations, or vertiginous symptoms.  Of note, patient hospitalized in [place] back in [id number]/[id number] for Complicated by elevated blood pressure.  Patient experienced a syncopal episode.  Work-up in  [place] did not reveal any obvious cause for the syncope.  As previously noted, patient with a past medical history significant for cardiovascular diagnoses.  Patient underwent diagnostic LEFT heart catheterization in 06/1998.  PCI was subsequently performed placing a stent (unknown type) to the proximal LAD.  Repeat diagnostic LEFT heart catheterization was performed in 06/1999.  PCI can performed placing a stent (unknown type) to the proximal RCA.  TTE performed on 12/11/2015 revealed a normal left ventricular systolic function with mild LVH; LVEF 55%.  There were no regional wall motion abnormalities.  RVSF normal.  There was mild mitral, tricuspid, and pulmonary valve regurgitation.  There was no evidence of a significant transvalvular gradient to suggest stenosis.  Patient reported to have suffered a CVA in 02/2016.  Patient suffered a fall resulting in a traumatic subarachnoid hemorrhage.  Myocardial perfusion imaging study performed on 12/15/2015 revealed a normal left ventricular systolic function with an EF of 57%.  There was no artifact noted.  Left ventricular cavity size normal.  SPECT images demonstrated a small perfusion abnormality of mild intensity present in the inferior region on stress images consistent with a very mild area of focal inferior wall ischemia.  Long-term cardiac event monitor study performed in 04/2021 revealed a predominant underlying atrial fibrillation with a heart rate range between 40 and 100 bpm.  There were occasional PVCs without evidence of significant pauses or signs pointing to a definitive etiology for patient's syncopal episode.  Patient with an atrial fibrillation diagnosis; CHA2DS2-VASc Score = 5 (age x 2, HTN, CVA x 2). His rate and rhythm are currently being maintained on oral carvedilol. He is chronically anticoagulated using apixaban; reported to be compliant with therapy with no evidence or reports of GI bleeding.  Blood pressure reasonably  controlled at 148/80 on currently prescribed beta-blocker (carvedilol), diuretic (HCTZ) and ARB (losartan) therapies. He is on a simvastatin for his HLD diagnosis and further ASCVD prevention.  Patient is not diabetic.  Functional capacity limited by age, arthritides, and multiple medical comorbidities, however patient still felt to be able to achieve at least 4 METS of activity without experiencing any significant angina/anginal equivalent symptoms.  No changes were made to his medication regimen.  Patient to follow-up with outpatient cardiology in 4 months or sooner if needed.  Anthony [name] is scheduled for an elective LEFT TAILOR'S BUNION correction on 09/18/2021 with Dr. Samara Deist, DPM.  Given patient's past medical history significant for cardiovascular diagnoses, presurgical cardiac clearance was sought by the PAT team. Per cardiology, "this patient is optimized for surgery and may proceed with the planned procedural course with a LOW/ACCEPTABLE risk of significant perioperative cardiovascular complications".  Again, this patient is on both daily anticoagulation and antiplatelet therapies.  He has been instructed on recommendations from his primary cardiologist for holding his apixaban prior to elective surgery.  It is unclear as to where the patient will actually be holding this medication as request for hold was not documented by his attending podiatrist.  Additionally, interviewing PAT RN simply noted to "stop Liquids and aspirin as instructed by your doctor before surgery".  Patient denies previous perioperative complications with anesthesia in the past. In review of the available records, it is noted that patient underwent a general anesthetic course here at [facility] ([facility]A III) in 08/2018 without documented complications.      09/09/2021    8:42 AM 06/20/2019    1:31 AM 06/19/2019    9:54 PM  Vitals with BMI  Height '5\' 8"'$     Weight 198 lbs    BMI  02.58    Systolic  527 782  Diastolic  73 423  Pulse  74 79    Providers/Specialists:   NOTE: Primary physician provider listed below. Patient may have been seen by APP or partner within same practice.   PROVIDER ROLE / SPECIALTY LAST Montel Culver, DPM Podiatry (Surgeon) 09/08/2021  Dion Body, MD Primary Care Provider 06/08/2021  Isaias Cowman, MD Cardiology 08/03/2021   Allergies:  Hornet venom  Current Home Medications:   No current facility-administered medications for this encounter.    acetaminophen (TYLENOL) 650 MG CR tablet   amoxicillin (AMOXIL) 500 MG tablet   apixaban (ELIQUIS) 5 MG TABS tablet   aspirin EC 81 MG tablet   carvedilol (COREG) 12.5 MG tablet   CORAL CALCIUM PO   EPINEPHrine 0.3 mg/0.3 mL IJ SOAJ injection   hydrochlorothiazide (MICROZIDE) 12.5 MG capsule   HYDROcodone-acetaminophen (NORCO) 5-325 MG tablet   losartan (COZAAR) 50 MG tablet   methimazole (TAPAZOLE) 5 MG tablet   Multiple Vitamins-Minerals (MULTIVITAMIN PO)   simvastatin (ZOCOR) 20 MG tablet   Zinc Acetate 25 MG CAPS   History:   Past Medical History:  Diagnosis Date   Basal cell carcinoma 07/26/2018   chest sternum   Basal cell carcinoma 08/20/2020   left chest parasternal, EDC 09/25/2020   Cardiomyopathy ([place])    Coronary artery disease    a.) PCI 06/1998: stent (unknown type) to pLAD; b.) PCI 06/1999: stent (unknown type) to pRCA; c.) MPI 12/15/2015: EF 57%, no RWMAs, mild inferior wall perfusion defect   CVA (cerebral vascular accident) ([place]) 02/2016   Diverticulosis    HLD (hyperlipidemia)    Hypertension  Hyperthyroidism 10/07/2016   Long term current use of anticoagulant    a.) apixaban   PAF (paroxysmal atrial fibrillation) ([place])    a.) CHA2DS2VASc = 5 (age x 2, HTN, CVA x 2);  b.) rate/rhythm maintained on oral carvedilol; chronically anticoagulated with apixaban   Subarachnoid hemorrhage following injury (fall) 02/18/2016   Tailor's bunion  of left foot    Toxic nodular goiter 1996   Tubular adenoma    Vasovagal syncope    Past Surgical History:  Procedure Laterality Date   CHEILECTOMY Left 11/11/2017   Procedure: CHEILECTOMY-HALLUX VALGUS AND DESTRUCTION OF LEISION FIFTH TOE;  Surgeon: Samara Deist, DPM;  Location: [facility] ORS;  Service: Podiatry;  Laterality: Left;   COLONOSCOPY N/A 08/22/2018   Procedure: COLONOSCOPY;  Surgeon: Lollie Sails, MD;  Location[facility] ENDOSCOPY;  Service: Endoscopy;  Laterality: N/A;   COLONOSCOPY N/A 10/11/1997   COLONOSCOPY N/A 12/12/2002   COLONOSCOPY N/A 04/02/2008   COLONOSCOPY N/A 07/20/2013   CORONARY ANGIOPLASTY WITH STENT PLACEMENT Left 06/1998   Procedure: CORONARY ANGIOPLASTY WITH STENT PLACEMENT (pLAD); Location: [place]; Surgeon: Isaias Cowman, MD   CORONARY ANGIOPLASTY WITH STENT PLACEMENT Left 06/1999   Procedure: CORONARY ANGIOPLASTY WITH STENT PLACEMENT (pRCA); Location: [place]; Surgeon: Isaias Cowman, MD   FOOT SURGERY     base of big toe   TONSILLECTOMY     TOTAL HIP ARTHROPLASTY Left 2011   TOTAL HIP ARTHROPLASTY Right 2017   No family history on file. Social History   Tobacco Use   Smoking status: Former    Types: Pipe, Cigars    Quit date: 11/02/2015    Years since quitting: 5.8   Smokeless tobacco: Never  Vaping Use   Vaping Use: Never used  Substance Use Topics   Alcohol use: Yes    Comment: OCCAS   Drug use: Never    Pertinent Clinical Results:  LABS: Labs reviewed: Acceptable for surgery.   Ref Range & Units 05/14/2021  WBC (White Blood Cell Count) 4.1 - 10.2 10^3/uL 6.9   RBC (Red Blood Cell Count) 4.69 - 6.13 10^6/uL 4.64 Low    Hemoglobin 14.1 - 18.1 gm/dL 14.6   Hematocrit 40.0 - 52.0 % 44.0   MCV (Mean Corpuscular Volume) 80.0 - 100.0 fl 94.8   MCH (Mean Corpuscular Hemoglobin) 27.0 - 31.2 pg 31.5 High    MCHC (Mean Corpuscular Hemoglobin Concentration) 32.0 - 36.0 gm/dL 33.2   Platelet Count 150 - 450 10^3/uL 154   RDW-CV (Red  Cell Distribution Width) 11.6 - 14.8 % 13.0   MPV (Mean Platelet Volume) 9.4 - 12.4 fl 10.7   Resulting Agency  [facility]  Specimen Collected: 05/14/21 11:07   Performed by: [place]: 05/14/21 12:00  Received From: [facility]  Result Received: 08/04/21 18:58   Component Date Value Ref Range Status   Sodium 09/09/2021 139  135 - 145 mmol/L Final   Potassium 09/09/2021 4.1  3.5 - 5.1 mmol/L Final   Chloride 09/09/2021 102  98 - 111 mmol/L Final   CO2 09/09/2021 27  22 - 32 mmol/L Final   Glucose, Bld 09/09/2021 144 (H)  70 - 99 mg/dL Final   Glucose reference range applies only to samples taken after fasting for at least 8 hours.   BUN 09/09/2021 33 (H)  8 - 23 mg/dL Final   Creatinine, Ser 09/09/2021 1.33 (H)  0.61 - 1.24 mg/dL Final   Calcium 09/09/2021 9.5  8.9 - 10.3  mg/dL Final   GFR, Estimated 09/09/2021 54 (L)  >60 mL/min Final   Comment: (NOTE) Calculated using the CKD-EPI Creatinine Equation (2021)    Anion gap 09/09/2021 10  5 - 15 Final   Performed at [facility], [address] [place], [place] [address]    ECG: Date: 09/09/2021 Time ECG obtained: 1037 AM Rate: 55 bpm Rhythm:  Atrial fibrillation with slow ventricular response Axis (leads I and aVF): Normal Intervals: QRS 68 ms. QTc 388 ms. ST segment and T wave changes: No evidence of acute ST segment elevation or depression.  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   Bone-on-bone arthritic changes noted.   Subchondral cystic changes demonstrated to the first MTPJ.   Lesser metatarsophalangeal joints, midtarsal joints, and subtalar  joints are well-maintained.   Focus to the fifth metatarsal reveals only mild subtle lateral deviation of the distal fifth metatarsal head.  This is best demonstrated on the AP view.  MYOCARDIAL PERFUSION IMAGING STUDY (LEXISCAN) performed on 12/15/2015 Normal left ventricular systolic function with a normal LVEF of 57% Normal myocardial thickening and wall motion Left ventricular cavity size normal SPECT images demonstrate a small perfusion abnormality of mild intensity present in the inferior region on stress images Very mild area of focal inferior wall ischemia  TRANSTHORACIC ECHOCARDIOGRAM performed on 12/11/2015 Normal left ventricular systolic function with an EF of 55% Normal left ventricular diastolic Doppler parameters Normal right ventricular systolic function Mild MR, TR, PR No AR Normal transvalvular gradients; no valvular stenosis No pericardial effusion  Impression and Plan:  Cristan Scherzer has been referred for pre-anesthesia review and clearance prior to him undergoing the planned anesthetic and procedural courses. Available labs, pertinent testing, and imaging results were personally reviewed by me. This patient has been appropriately cleared by cardiology with an overall LOW/ACCEPTABLE risk of significant perioperative cardiovascular complications.  Based on clinical review performed today (09/15/21), barring any significant acute changes in the patient's overall condition, it is anticipated that he will be able to proceed with the planned surgical intervention. Any acute changes in clinical condition may necessitate his procedure being postponed and/or cancelled. Patient will meet with anesthesia team (MD and/or CRNA) on the day of his procedure for preoperative evaluation/assessment. Questions regarding anesthetic course will be fielded at that time.   Pre-surgical instructions were reviewed with the patient during his PAT appointment and questions were fielded by  PAT clinical staff. Patient was advised that if any questions or concerns arise prior to his procedure then he should return a call to PAT and/or his surgeon's office to discuss.  Honor Loh, MSN, APRN, FNP-C, CEN [facility]  Peri-operative Services Nurse Practitioner Phone: [phone] Fax: [phone] 09/15/21 10:38 AM  NOTE: This note has been prepared using Dragon dictation software. Despite my best ability to proofread, there is always the potential that unintentional transcriptional errors may still occur from this process.

## 2021-09-15 ENCOUNTER — Encounter: Payer: Self-pay | Admitting: Podiatry

## 2021-09-17 MED ORDER — CHLORHEXIDINE GLUCONATE 0.12 % MT SOLN: 15.0000 mL | Freq: Once | OROMUCOSAL | Status: AC

## 2021-09-17 MED ORDER — CEFAZOLIN SODIUM-DEXTROSE 2-4 GM/100ML-% IV SOLN
2.0000 g | INTRAVENOUS | Status: AC
  Administered 2021-09-18: 2 g via INTRAVENOUS

## 2021-09-17 MED ORDER — ORAL CARE MOUTH RINSE: 15.0000 mL | Freq: Once | OROMUCOSAL | Status: AC

## 2021-09-17 MED ORDER — FAMOTIDINE 20 MG PO TABS: 20.0000 mg | ORAL_TABLET | Freq: Once | ORAL | Status: AC

## 2021-09-17 MED ORDER — LACTATED RINGERS IV SOLN: INTRAVENOUS | Status: DC

## 2021-09-18 ENCOUNTER — Ambulatory Visit: Payer: Medicare Other | Admitting: Urgent Care

## 2021-09-18 ENCOUNTER — Other Ambulatory Visit: Payer: Self-pay

## 2021-09-18 ENCOUNTER — Encounter: Payer: Self-pay | Admitting: Podiatry

## 2021-09-18 ENCOUNTER — Ambulatory Visit
Admission: RE | Admit: 2021-09-18 | Discharge: 2021-09-18 | Disposition: A | Discharge status: Home / Self Care | Payer: Medicare Other | Attending: Podiatry | Admitting: Podiatry

## 2021-09-18 ENCOUNTER — Ambulatory Visit: Payer: Medicare Other

## 2021-09-18 ENCOUNTER — Encounter
Admission: RE | Disposition: A | Discharge status: Home / Self Care | Payer: Self-pay | Source: Home / Self Care | Attending: Podiatry

## 2021-09-18 DIAGNOSIS — Z7901 Long term (current) use of anticoagulants: Secondary | ICD-10-CM | POA: Diagnosis not present

## 2021-09-18 DIAGNOSIS — E785 Hyperlipidemia, unspecified: Secondary | ICD-10-CM | POA: Insufficient documentation

## 2021-09-18 DIAGNOSIS — I2510 Atherosclerotic heart disease of native coronary artery without angina pectoris: Secondary | ICD-10-CM | POA: Insufficient documentation

## 2021-09-18 DIAGNOSIS — Z8673 Personal history of transient ischemic attack (TIA), and cerebral infarction without residual deficits: Secondary | ICD-10-CM | POA: Insufficient documentation

## 2021-09-18 DIAGNOSIS — Z87891 Personal history of nicotine dependence: Secondary | ICD-10-CM | POA: Diagnosis not present

## 2021-09-18 DIAGNOSIS — Z85828 Personal history of other malignant neoplasm of skin: Secondary | ICD-10-CM | POA: Diagnosis not present

## 2021-09-18 DIAGNOSIS — Z96643 Presence of artificial hip joint, bilateral: Secondary | ICD-10-CM | POA: Diagnosis not present

## 2021-09-18 DIAGNOSIS — I480 Paroxysmal atrial fibrillation: Secondary | ICD-10-CM | POA: Insufficient documentation

## 2021-09-18 DIAGNOSIS — M21622 Bunionette of left foot: Secondary | ICD-10-CM | POA: Diagnosis present

## 2021-09-18 DIAGNOSIS — Z79899 Other long term (current) drug therapy: Secondary | ICD-10-CM | POA: Diagnosis not present

## 2021-09-18 DIAGNOSIS — Z955 Presence of coronary angioplasty implant and graft: Secondary | ICD-10-CM | POA: Diagnosis not present

## 2021-09-18 DIAGNOSIS — I429 Cardiomyopathy, unspecified: Secondary | ICD-10-CM | POA: Insufficient documentation

## 2021-09-18 DIAGNOSIS — E0590 Thyrotoxicosis, unspecified without thyrotoxic crisis or storm: Secondary | ICD-10-CM | POA: Diagnosis not present

## 2021-09-18 DIAGNOSIS — I10 Essential (primary) hypertension: Secondary | ICD-10-CM | POA: Diagnosis not present

## 2021-09-18 HISTORY — PX: WEIL OSTEOTOMY: SHX5044

## 2021-09-18 HISTORY — DX: Syncope and collapse: R55

## 2021-09-18 HISTORY — DX: Benign neoplasm, unspecified site: D36.9

## 2021-09-18 HISTORY — DX: Hyperlipidemia, unspecified: E78.5

## 2021-09-18 HISTORY — DX: Bunionette of left foot: M21.622

## 2021-09-18 HISTORY — DX: Long term (current) use of anticoagulants: Z79.01

## 2021-09-18 HISTORY — DX: Paroxysmal atrial fibrillation: I48.0

## 2021-09-18 SURGERY — OSTEOTOMY, WEIL
Anesthesia: General | Site: Foot | Laterality: Left

## 2021-09-18 MED ORDER — PROPOFOL 1000 MG/100ML IV EMUL
INTRAVENOUS | Status: AC
  Filled 2021-09-18: qty 100

## 2021-09-18 MED ORDER — FAMOTIDINE 20 MG PO TABS
ORAL_TABLET | ORAL | Status: AC
  Administered 2021-09-18: 20 mg via ORAL
  Filled 2021-09-18: qty 1

## 2021-09-18 MED ORDER — LIDOCAINE HCL 1 % IJ SOLN
INTRAMUSCULAR | Status: DC | PRN
  Administered 2021-09-18: 10 mL via INTRAMUSCULAR

## 2021-09-18 MED ORDER — ONDANSETRON HCL 4 MG PO TABS: 4.0000 mg | ORAL_TABLET | Freq: Four times a day (QID) | ORAL | Status: DC | PRN

## 2021-09-18 MED ORDER — CEFAZOLIN SODIUM-DEXTROSE 2-4 GM/100ML-% IV SOLN
INTRAVENOUS | Status: AC
  Filled 2021-09-18: qty 100

## 2021-09-18 MED ORDER — BUPIVACAINE HCL (PF) 0.5 % IJ SOLN
INTRAMUSCULAR | Status: AC
  Filled 2021-09-18: qty 30

## 2021-09-18 MED ORDER — FENTANYL CITRATE (PF) 100 MCG/2ML IJ SOLN: 25.0000 ug | INTRAMUSCULAR | Status: DC | PRN

## 2021-09-18 MED ORDER — FENTANYL CITRATE (PF) 100 MCG/2ML IJ SOLN
INTRAMUSCULAR | Status: AC
  Filled 2021-09-18: qty 2

## 2021-09-18 MED ORDER — CHLORHEXIDINE GLUCONATE 0.12 % MT SOLN
OROMUCOSAL | Status: AC
  Administered 2021-09-18: 15 mL via OROMUCOSAL
  Filled 2021-09-18: qty 15

## 2021-09-18 MED ORDER — OXYCODONE-ACETAMINOPHEN 5-325 MG PO TABS: 1.0000 | ORAL_TABLET | Freq: Four times a day (QID) | ORAL | 0 refills | Status: DC | PRN

## 2021-09-18 MED ORDER — METOCLOPRAMIDE HCL 10 MG PO TABS: 5.0000 mg | ORAL_TABLET | Freq: Three times a day (TID) | ORAL | Status: DC | PRN

## 2021-09-18 MED ORDER — METOCLOPRAMIDE HCL 5 MG/ML IJ SOLN: 5.0000 mg | Freq: Three times a day (TID) | INTRAMUSCULAR | Status: DC | PRN

## 2021-09-18 MED ORDER — OXYCODONE HCL 5 MG PO TABS: 5.0000 mg | ORAL_TABLET | Freq: Once | ORAL | Status: DC | PRN

## 2021-09-18 MED ORDER — PROPOFOL 10 MG/ML IV BOLUS
INTRAVENOUS | Status: DC | PRN
  Administered 2021-09-18: 50 mg via INTRAVENOUS

## 2021-09-18 MED ORDER — LIDOCAINE HCL (CARDIAC) PF 100 MG/5ML IV SOSY
PREFILLED_SYRINGE | INTRAVENOUS | Status: DC | PRN
  Administered 2021-09-18: 50 mg via INTRAVENOUS

## 2021-09-18 MED ORDER — LIDOCAINE HCL (PF) 1 % IJ SOLN
INTRAMUSCULAR | Status: AC
  Filled 2021-09-18: qty 30

## 2021-09-18 MED ORDER — OXYCODONE HCL 5 MG/5ML PO SOLN: 5.0000 mg | Freq: Once | ORAL | Status: DC | PRN

## 2021-09-18 MED ORDER — FENTANYL CITRATE (PF) 100 MCG/2ML IJ SOLN
INTRAMUSCULAR | Status: DC | PRN
  Administered 2021-09-18: 50 ug via INTRAVENOUS

## 2021-09-18 MED ORDER — ONDANSETRON HCL 4 MG/2ML IJ SOLN: 4.0000 mg | Freq: Four times a day (QID) | INTRAMUSCULAR | Status: DC | PRN

## 2021-09-18 MED ORDER — 0.9 % SODIUM CHLORIDE (POUR BTL) OPTIME
TOPICAL | Status: DC | PRN
  Administered 2021-09-18: 1000 mL

## 2021-09-18 MED ORDER — PROPOFOL 500 MG/50ML IV EMUL
INTRAVENOUS | Status: DC | PRN
  Administered 2021-09-18: 100 ug/kg/min via INTRAVENOUS

## 2021-09-18 SURGICAL SUPPLY — 43 items
BLADE OSC/SAGITTAL 5.5X25 (BLADE) ×1 IMPLANT
BLADE OSC/SAGITTAL MD 5.5X18 (BLADE) IMPLANT
BLADE SURG 15 STRL LF DISP TIS (BLADE) ×2 IMPLANT
BLADE SURG 15 STRL SS (BLADE) ×2
BNDG CMPR 5X4 CHSV STRCH STRL (GAUZE/BANDAGES/DRESSINGS) ×1
BNDG CMPR STD VLCR NS LF 5.8X4 (GAUZE/BANDAGES/DRESSINGS) ×1
BNDG COHESIVE 4X5 TAN STRL LF (GAUZE/BANDAGES/DRESSINGS) IMPLANT
BNDG ELASTIC 4X5.8 VLCR NS LF (GAUZE/BANDAGES/DRESSINGS) ×2 IMPLANT
BNDG ESMARK 4X12 TAN STRL LF (GAUZE/BANDAGES/DRESSINGS) ×1 IMPLANT
BNDG GAUZE DERMACEA FLUFF 4 (GAUZE/BANDAGES/DRESSINGS) ×1 IMPLANT
BNDG GZE 12X3 1 PLY HI ABS (GAUZE/BANDAGES/DRESSINGS) ×1
BNDG GZE DERMACEA 4 6PLY (GAUZE/BANDAGES/DRESSINGS) ×1
BNDG STRETCH GAUZE 3IN X12FT (GAUZE/BANDAGES/DRESSINGS) ×1 IMPLANT
CUFF TOURN SGL QUICK 12 (TOURNIQUET CUFF) IMPLANT
CUFF TOURN SGL QUICK 18X4 (TOURNIQUET CUFF) IMPLANT
DRAPE FLUOR MINI C-ARM 54X84 (DRAPES) ×1 IMPLANT
DURAPREP 26ML APPLICATOR (WOUND CARE) ×1 IMPLANT
ELECT REM PT RETURN 9FT ADLT (ELECTROSURGICAL) ×1
ELECTRODE REM PT RTRN 9FT ADLT (ELECTROSURGICAL) ×1 IMPLANT
GAUZE SPONGE 4X4 12PLY STRL (GAUZE/BANDAGES/DRESSINGS) ×1 IMPLANT
GAUZE XEROFORM 1X8 LF (GAUZE/BANDAGES/DRESSINGS) ×1 IMPLANT
GLOVE BIO SURGEON STRL SZ7.5 (GLOVE) ×1 IMPLANT
GLOVE SURG UNDER LTX SZ8 (GLOVE) ×1 IMPLANT
GOWN STRL REUS W/ TWL XL LVL3 (GOWN DISPOSABLE) ×2 IMPLANT
GOWN STRL REUS W/TWL XL LVL3 (GOWN DISPOSABLE) ×2
KIT TURNOVER KIT A (KITS) ×1 IMPLANT
LABEL OR SOLS (LABEL) ×1 IMPLANT
MANIFOLD NEPTUNE II (INSTRUMENTS) ×1 IMPLANT
NDL HYPO 25X1 1.5 SAFETY (NEEDLE) ×3 IMPLANT
NDL SAFETY ECLIP 18X1.5 (MISCELLANEOUS) ×1 IMPLANT
NEEDLE HYPO 25X1 1.5 SAFETY (NEEDLE) ×3 IMPLANT
NS IRRIG 500ML POUR BTL (IV SOLUTION) ×1 IMPLANT
PACK EXTREMITY ARMC (MISCELLANEOUS) ×1 IMPLANT
STOCKINETTE IMPERV 14X48 (MISCELLANEOUS) IMPLANT
STRIP CLOSURE SKIN 1/4X4 (GAUZE/BANDAGES/DRESSINGS) ×1 IMPLANT
SUT VIC AB 4-0 FS2 27 (SUTURE) ×1 IMPLANT
SUT VICRYL AB 3-0 FS1 BRD 27IN (SUTURE) ×1 IMPLANT
SYR 10ML LL (SYRINGE) ×2 IMPLANT
TRAP FLUID SMOKE EVACUATOR (MISCELLANEOUS) ×1 IMPLANT
WATER STERILE IRR 500ML POUR (IV SOLUTION) ×1 IMPLANT
WIRE K 1.1X9 (WIRE) IMPLANT
WIRE Z .045 C-WIRE SPADE TIP (WIRE) ×2 IMPLANT
WIRE Z .062 C-WIRE SPADE TIP (WIRE) ×2 IMPLANT

## 2021-09-18 NOTE — Anesthesia Postprocedure Evaluation (Signed)
Anesthesia Post Note  Patient: Anthony [name]  Procedure(s) Performed: Rowe Pavy (Left: Foot)  Patient location during evaluation: PACU Anesthesia Type: General Level of consciousness: awake and alert Pain management: pain level controlled Vital Signs Assessment: post-procedure vital signs reviewed and stable Respiratory status: spontaneous breathing, nonlabored ventilation, respiratory function stable and patient connected to nasal cannula oxygen Cardiovascular status: blood pressure returned to baseline and stable Postop Assessment: no apparent nausea or vomiting Anesthetic complications: no   No notable events documented.   Last Vitals:  Vitals:   09/18/21 1315 09/18/21 1330  BP: 138/70 (!) 156/76  Pulse: (!) 56 66  Resp: 11 12  Temp:    SpO2: 97% 99%    Last Pain:  Vitals:   09/18/21 1330  TempSrc:   PainSc: 0-No pain                 Precious Haws Karina Nofsinger

## 2021-09-18 NOTE — Discharge Instructions (Addendum)
[facility]  POST OPERATIVE INSTRUCTIONS FOR DR. FOWLER AND [facility] PODIAT[facility]PART[facility]   Take your medication as prescribed.  Pain medication should be taken only as needed.  Keep the dressing clean, dry and intact.  Keep your foot elevated above the heart level for the first 48 hours.  Walking to the bathroom and brief periods of walking are acceptable, unless we have instructed you to be non-weight bearing.  Always wear your post-op shoe when walking.  Always use your crutches if you are to be non-weight bearing.  Do not take a shower. Baths are permissible as long as the foot is kept out of the water.   Every hour you are awake:  Bend your knee 15 times. Flex foot 15 times Massage calf 15 times  Call [facility] ([phone]) if any of the following problems occur: You develop a temperature or fever. The bandage becomes saturated with blood. Medication does not stop your pain. Injury of the foot occurs. Any symptoms of infection including redness, odor, or red streaks running from wound.    AMBULATORY SURGERY  DISCHARGE INSTRUCTIONS   The drugs that you were given will stay in your system until tomorrow so for the next 24 hours you should not:  Drive an automobile Make any legal decisions Drink any alcoholic beverage   You may resume regular meals tomorrow.  Today it is better to start with liquids and gradually work up to solid foods.  You may eat anything you prefer, but it is better to start with liquids, then soup and crackers, and gradually work up to solid foods.   Please notify your doctor immediately if you have any unusual bleeding, trouble breathing, redness and pain at the surgery site, drainage, fever, or pain not relieved by medication.    Additional Instructions:        Please contact your physician with any problems or Same Day Surgery at [phone], Monday through  Friday 6 am to 4 pm, or [place] [facility] number at [phone]. 

## 2021-09-18 NOTE — Transfer of Care (Signed)
Immediate Anesthesia Transfer of Care Note  Patient: Anthony [name]  Procedure(s) Performed: Rowe Pavy (Left: Foot)  Patient Location: PACU  Anesthesia Type:General  Level of Consciousness: awake, drowsy and patient cooperative  Airway & Oxygen Therapy: Patient Spontanous Breathing and Patient connected to face mask oxygen  Post-op Assessment: Report given to RN and Post -op Vital signs reviewed and stable  Post vital signs: Reviewed and stable  Last Vitals:  Vitals Value Taken Time  BP    Temp    Pulse 61 09/18/21 1302  Resp 12 09/18/21 1303  SpO2 100 % 09/18/21 1302  Vitals shown include unvalidated device data.  Last Pain:  Vitals:   09/18/21 0944  TempSrc: Temporal  PainSc: 0-No pain         Complications: No notable events documented.

## 2021-09-18 NOTE — H&P (Signed)
HISTORY AND PHYSICAL INTERVAL NOTE:  09/18/2021  11:52 AM  Anthony [name]  has presented today for surgery, with the diagnosis of M21.622 - Tailor's bunion of left foot.  The various methods of treatment have been discussed with the patient.  No guarantees were given.  After consideration of risks, benefits and other options for treatment, the patient has consented to surgery.  I have reviewed the patients' chart and labs.     [name] history and physical examination was performed in my office.  The patient was reexamined.  There have been no changes to this history and physical examination.  Anthony [name]

## 2021-09-18 NOTE — Anesthesia Procedure Notes (Signed)
Procedure Name: MAC Date/Time: 09/18/2021 12:02 PM  Performed by: Jerrye Noble, CRNAPre-anesthesia Checklist: Emergency Drugs available, Patient identified, Suction available and Patient being monitored Patient Re-evaluated:Patient Re-evaluated prior to induction Oxygen Delivery Method: Simple face mask

## 2021-09-18 NOTE — Op Note (Signed)
Operative note   Surgeon:Tibor Lemmons Lawyer: None    Preop diagnosis: Tailor's bunion deformity left foot    Postop diagnosis: Same    Procedure: 1.  Distal fifth metatarsal Z osteotomy left foot 2.  Intraoperative fluoroscopy use    EBL: Minimal    Anesthesia:local and general    Hemostasis: Ankle tourniquet inflated to 200 mmHg for approximately 30 minutes    Specimen: None    Complications: None    Operative indications:Anthony [name] is an 81 y.o. that presents today for surgical intervention.  The risks/benefits/alternatives/complications have been discussed and consent has been given.    Procedure:  Patient was brought into the OR and placed on the operating table in thesupine position. After anesthesia was obtained theleft lower extremity was prepped and draped in usual sterile fashion.  Attention was directed to the dorsal lateral aspect of the left fifth MTPJ where an incision was performed.  Sharp and blunt dissection carried down to the capsule.  A longitudinal capsulotomy was performed.  The prominent lateral eminence was noted and transected with a power saw.  Next the Z osteotomy was mapped out and completed with a power saw.  The capital fragment was translocated more medial.  This was then stabilized with a 0.045 threaded K wire.  Good stability and alignment was noted.  The ensuing overhanging ledge was then transected and smooth.  The wound was flushed with copious amounts of irrigation.  Closure was then performed with a 4-0 Vicryl for the deeper capsular tissue and subcutaneous tissue and a 4-0 Monocryl for the skin.  Bulky sterile dressing was applied to the left foot.    Patient tolerated the procedure and anesthesia well.  Was transported from the OR to the PACU with all vital signs stable and vascular status intact. To be discharged per routine protocol.  Will follow up in approximately 1 week in the outpatient clinic.

## 2021-09-18 NOTE — Anesthesia Preprocedure Evaluation (Signed)
Anesthesia Evaluation  Patient identified by MRN, date of birth, ID band Patient awake    Reviewed: Allergy & Precautions, NPO status , Patient's Chart, lab work & pertinent test results  History of Anesthesia Complications Negative for: history of anesthetic complications  Airway Mallampati: III  TM Distance: <3 FB Neck ROM: full    Dental  (+) Chipped   Pulmonary neg pulmonary ROS, neg shortness of breath, former smoker,    Pulmonary exam normal        Cardiovascular Exercise Tolerance: Good hypertension, + CAD  + dysrhythmias Atrial Fibrillation      Neuro/Psych CVA negative psych ROS   GI/Hepatic negative GI ROS, Neg liver ROS, neg GERD  ,  Endo/Other  Hyperthyroidism   Renal/GU negative Renal ROS  negative genitourinary   Musculoskeletal   Abdominal   Peds  Hematology negative hematology ROS (+)   Anesthesia Other Findings Past Medical History: 07/26/2018: Basal cell carcinoma     Comment:  chest sternum 08/20/2020: Basal cell carcinoma     Comment:  left chest parasternal, EDC 09/25/2020 No date: Cardiomyopathy [facility]) No date: Coronary artery disease     Comment:  a.) PCI 06/1998: stent (unknown type) to pLAD; b.) PCI               06/1999: stent (unknown type) to pRCA; c.) MPI               12/15/2015: EF 57%, no RWMAs, mild inferior wall               perfusion defect 02/2016: CVA (cerebral vascular accident) ([place]) No date: Diverticulosis No date: HLD (hyperlipidemia) No date: Hypertension 10/07/2016: Hyperthyroidism No date: Long term current use of anticoagulant     Comment:  a.) apixaban No date: PAF (paroxysmal atrial fibrillation) (HCC)     Comment:  a.) CHA2DS2VASc = 5 (age x 2, HTN, CVA x 2);  b.)               rate/rhythm maintained on oral carvedilol; chronically               anticoagulated with apixaban 02/18/2016: Subarachnoid hemorrhage following injury (fall) No date: 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 No date: Tubular adenoma No date: Vasovagal syncope  Past Surgical History: 11/11/2017: CHEILECTOMY; Left     Comment:  Procedure: CHEILECTOMY-HALLUX VALGUS AND DESTRUCTION OF               LEISION FIFTH TOE;  Surgeon: Samara Deist, DPM;                Location: [facility] ORS;  Service: Podiatry;  Laterality:               Left; 08/22/2018: COLONOSCOPY; N/A     Comment:  Procedure: COLONOSCOPY;  Surgeon: Lollie Sails,               MD;  Location: [facility] ENDOSCOPY;  Service: Endoscopy;                Laterality: N/A; 10/11/1997: COLONOSCOPY; N/A 12/12/2002: COLONOSCOPY; N/A 04/02/2008: COLONOSCOPY; N/A 07/20/2013: COLONOSCOPY; N/A 06/1998: CORONARY ANGIOPLASTY WITH STENT PLACEMENT; Left     Comment:  Procedure: CORONARY ANGIOPLASTY WITH STENT PLACEMENT               (pLAD); Location: [place]; Surgeon: Isaias Cowman, MD 06/1999: CORONARY ANGIOPLASTY WITH STENT PLACEMENT; Left     Comment:  Procedure: CORONARY ANGIOPLASTY WITH STENT PLACEMENT               (  pRCA); Location: [place]; Surgeon: Isaias Cowman, MD No date: FOOT SURGERY     Comment:  base of big toe No date: TONSILLECTOMY 2011: [place]; Left 2017: TOTAL HIP ARTHROPLASTY; Right  BMI    Body Mass Index: 30.11 kg/m      Reproductive/Obstetrics negative OB ROS                             Anesthesia Physical Anesthesia Plan  ASA: 3  Anesthesia Plan: General   Post-op Pain Management:    Induction: Intravenous  PONV Risk Score and Plan: Propofol infusion and TIVA  Airway Management Planned: Natural Airway and Nasal Cannula  Additional Equipment:   Intra-op Plan:   Post-operative Plan:   Informed Consent: I have reviewed the patients History and Physical, chart, labs and discussed the procedure including the risks, benefits and alternatives for the proposed anesthesia with the patient or authorized representative  who has indicated his/her understanding and acceptance.     Dental Advisory Given  Plan Discussed with: Anesthesiologist, CRNA and Surgeon  Anesthesia Plan Comments: (Patient consented for risks of anesthesia including but not limited to:  - adverse reactions to medications - risk of airway placement if required - damage to eyes, teeth, lips or other oral mucosa - nerve damage due to positioning  - sore throat or hoarseness - Damage to heart, brain, nerves, lungs, other parts of body or loss of life  Patient voiced understanding.)        Anesthesia Quick Evaluation

## 2021-09-19 ENCOUNTER — Encounter: Payer: Self-pay | Admitting: Podiatry

## 2021-10-22 ENCOUNTER — Other Ambulatory Visit: Payer: Self-pay | Admitting: Podiatry

## 2021-10-26 ENCOUNTER — Encounter
Admission: RE | Admit: 2021-10-26 | Discharge: 2021-10-26 | Disposition: A | Discharge status: Home / Self Care | Payer: Medicare Other | Source: Ambulatory Visit | Attending: Podiatry | Admitting: Podiatry

## 2021-10-26 ENCOUNTER — Other Ambulatory Visit: Payer: Self-pay

## 2021-10-26 NOTE — Patient Instructions (Signed)
Your procedure is scheduled on: 10/28/21 Report to [place]. To find out your arrival time please call [phone] between 1PM - 3PM on 10/26/21.  Remember: Instructions that are not followed completely may result in serious medical risk, up to and including death, or upon the discretion of your surgeon and anesthesiologist your surgery may need to be rescheduled.     _X__ 1. Do not eat food after midnight the night before your procedure.                 No gum chewing or hard candies. You may drink clear liquids up to 2 hours                 before you are scheduled to arrive for your surgery- DO not drink clear                 liquids within 2 hours of the start of your surgery.                 Clear Liquids include:  water, apple juice without pulp, clear carbohydrate                 drink such as Clearfast or Gatorade, Black Coffee or Tea (Do not add                 anything to coffee or tea). Diabetics water only  __X__2.  On the morning of surgery brush your teeth with toothpaste and water, you                 may rinse your mouth with mouthwash if you wish.  Do not swallow any              toothpaste of mouthwash.     _X__ 3.  No Alcohol for 24 hours before or after surgery.   _X__ 4.  Do Not Smoke or use e-cigarettes For 24 Hours Prior to Your Surgery.                 Do not use any chewable tobacco products for at least 6 hours prior to                 surgery.  ____  5.  Bring all medications with you on the day of surgery if instructed.   __X__  6.  Notify your doctor if there is any change in your medical condition      (cold, fever, infections).     Do not wear jewelry, make-up, hairpins, clips or nail polish. Do not wear lotions, powders, or perfumes.  Do not shave 48 hours prior to surgery. Men may shave face and neck. Do not bring valuables to the hospital.    [facility] is not responsible for any belongings  or valuables.  Contacts, dentures/partials or body piercings may not be worn into surgery. Bring a case for your contacts, glasses or hearing aids, a denture cup will be supplied. Leave your suitcase in the car. After surgery it may be brought to your room. For patients admitted to the hospital, discharge time is determined by your treatment team.   Patients discharged the day of surgery will not be allowed to drive home.    __X__ Take these medicines the morning of surgery with A SIP OF WATER:    1. carvedilol (COREG) 12.5 MG tablet  2. methimazole (TAPAZOLE) 2.5 MG tablet  3.   4.  5.  6.  ____ Fleet Enema (as directed)   ____ Use CHG Soap/SAGE wipes as directed  ____ Use inhalers on the day of surgery  ____ Stop metformin/Janumet/Farxiga 2 days prior to surgery    ____ Take 1/2 of usual insulin dose the night before surgery. No insulin the morning          of surgery.   ____ Stop Blood Thinners Coumadin/Plavix/Xarelto/Pleta/Pradaxa/Eliquis/Effient/Aspirin  on   Or contact your Surgeon, Cardiologist or Medical Doctor regarding  ability to stop your blood thinners  __X__ Stop Anti-inflammatories 7 days before surgery such as Advil, Ibuprofen, Motrin,  BC or Goodies Powder, Naprosyn, Naproxen, Aleve, Aspirin    __X__ Stop all herbal supplements, fish oil or vitamin E until after surgery.    ____ Bring C-Pap to the hospital.

## 2021-10-26 NOTE — Patient Instructions (Signed)
Your procedure is scheduled on: 10/28/21 Report to [place]. To find out your arrival time please call [phone] between 1PM - 3PM on 10/27/21.  Remember: Instructions that are not followed completely may result in serious medical risk, up to and including death, or upon the discretion of your surgeon and anesthesiologist your surgery may need to be rescheduled.     _X__ 1. Do not eat food after midnight the night before your procedure.                 No gum chewing or hard candies. You may drink clear liquids up to 2 hours                 before you are scheduled to arrive for your surgery- DO not drink clear                 liquids within 2 hours of the start of your surgery.                 Clear Liquids include:  water, apple juice without pulp, clear carbohydrate                 drink such as Clearfast or Gatorade, Black Coffee or Tea (Do not add                 anything to coffee or tea). Diabetics water only  __X__2.  On the morning of surgery brush your teeth with toothpaste and water, you                 may rinse your mouth with mouthwash if you wish.  Do not swallow any              toothpaste of mouthwash.     _X__ 3.  No Alcohol for 24 hours before or after surgery.   _X__ 4.  Do Not Smoke or use e-cigarettes For 24 Hours Prior to Your Surgery.                 Do not use any chewable tobacco products for at least 6 hours prior to                 surgery.  ____  5.  Bring all medications with you on the day of surgery if instructed.   __X__  6.  Notify your doctor if there is any change in your medical condition      (cold, fever, infections).     Do not wear jewelry, make-up, hairpins, clips or nail polish. Do not wear lotions, powders, or perfumes.  Do not shave 48 hours prior to surgery. Men may shave face and neck. Do not bring valuables to the hospital.    [facility] is not responsible for any belongings  or valuables.  Contacts, dentures/partials or body piercings may not be worn into surgery. Bring a case for your contacts, glasses or hearing aids, a denture cup will be supplied. Leave your suitcase in the car. After surgery it may be brought to your room. For patients admitted to the hospital, discharge time is determined by your treatment team.   Patients discharged the day of surgery will not be allowed to drive home.   Please read over the following fact sheets that you were given:   MRSA Information  __X__ Take these medicines the morning of surgery with A SIP OF WATER:  1.   2.   3.   4.  5.  6.  ____ Fleet Enema (as directed)   __X__ Use CHG Soap/SAGE wipes as directed  ____ Use inhalers on the day of surgery  ____ Stop metformin/Janumet/Farxiga 2 days prior to surgery    ____ Take 1/2 of usual insulin dose the night before surgery. No insulin the morning          of surgery.   ____ Stop Blood Thinners Coumadin/Plavix/Xarelto/Pleta/Pradaxa/Eliquis/Effient/Aspirin  on   Or contact your Surgeon, Cardiologist or Medical Doctor regarding  ability to stop your blood thinners  __X__ Stop Anti-inflammatories 7 days before surgery such as Advil, Ibuprofen, Motrin,  BC or Goodies Powder, Naprosyn, Naproxen, Aleve, Aspirin    __X__ Stop all herbal supplements, fish oil or vitamin E until after surgery.    ____ Bring C-Pap to the hospital.      

## 2021-10-28 ENCOUNTER — Ambulatory Visit: Payer: Medicare Other

## 2021-10-28 ENCOUNTER — Other Ambulatory Visit: Payer: Self-pay

## 2021-10-28 ENCOUNTER — Encounter: Payer: Self-pay | Admitting: Podiatry

## 2021-10-28 ENCOUNTER — Encounter
Admission: RE | Disposition: A | Discharge status: Home / Self Care | Payer: Self-pay | Source: Ambulatory Visit | Attending: Podiatry

## 2021-10-28 ENCOUNTER — Ambulatory Visit: Payer: Medicare Other | Admitting: General Practice

## 2021-10-28 ENCOUNTER — Ambulatory Visit
Admission: RE | Admit: 2021-10-28 | Discharge: 2021-10-28 | Disposition: A | Discharge status: Home / Self Care | Payer: Medicare Other | Source: Ambulatory Visit | Attending: Podiatry | Admitting: Podiatry

## 2021-10-28 DIAGNOSIS — I10 Essential (primary) hypertension: Secondary | ICD-10-CM | POA: Insufficient documentation

## 2021-10-28 DIAGNOSIS — M21622 Bunionette of left foot: Secondary | ICD-10-CM | POA: Diagnosis not present

## 2021-10-28 DIAGNOSIS — I2510 Atherosclerotic heart disease of native coronary artery without angina pectoris: Secondary | ICD-10-CM | POA: Diagnosis not present

## 2021-10-28 DIAGNOSIS — Z87891 Personal history of nicotine dependence: Secondary | ICD-10-CM | POA: Insufficient documentation

## 2021-10-28 DIAGNOSIS — I4891 Unspecified atrial fibrillation: Secondary | ICD-10-CM | POA: Insufficient documentation

## 2021-10-28 DIAGNOSIS — E0590 Thyrotoxicosis, unspecified without thyrotoxic crisis or storm: Secondary | ICD-10-CM | POA: Diagnosis not present

## 2021-10-28 DIAGNOSIS — T8484XA Pain due to internal orthopedic prosthetic devices, implants and grafts, initial encounter: Secondary | ICD-10-CM | POA: Diagnosis present

## 2021-10-28 HISTORY — PX: HARDWARE REMOVAL: SHX979

## 2021-10-28 SURGERY — REMOVAL, HARDWARE
Anesthesia: General | Site: Foot | Laterality: Left

## 2021-10-28 MED ORDER — OXYCODONE HCL 5 MG/5ML PO SOLN: 5.0000 mg | Freq: Once | ORAL | Status: DC | PRN

## 2021-10-28 MED ORDER — CEFAZOLIN SODIUM-DEXTROSE 2-4 GM/100ML-% IV SOLN
INTRAVENOUS | Status: AC
  Filled 2021-10-28: qty 100

## 2021-10-28 MED ORDER — FAMOTIDINE 20 MG PO TABS: 20.0000 mg | ORAL_TABLET | Freq: Once | ORAL | Status: AC

## 2021-10-28 MED ORDER — ONDANSETRON HCL 4 MG/2ML IJ SOLN: 4.0000 mg | Freq: Once | INTRAMUSCULAR | Status: DC | PRN

## 2021-10-28 MED ORDER — ACETAMINOPHEN 10 MG/ML IV SOLN: 1000.0000 mg | Freq: Once | INTRAVENOUS | Status: DC | PRN

## 2021-10-28 MED ORDER — CHLORHEXIDINE GLUCONATE 0.12 % MT SOLN
OROMUCOSAL | Status: AC
  Administered 2021-10-28: 15 mL via OROMUCOSAL
  Filled 2021-10-28: qty 15

## 2021-10-28 MED ORDER — FENTANYL CITRATE (PF) 100 MCG/2ML IJ SOLN: 25.0000 ug | INTRAMUSCULAR | Status: DC | PRN

## 2021-10-28 MED ORDER — BUPIVACAINE-EPINEPHRINE (PF) 0.25% -1:200000 IJ SOLN
INTRAMUSCULAR | Status: DC | PRN
  Administered 2021-10-28: 5 mL

## 2021-10-28 MED ORDER — PROPOFOL 500 MG/50ML IV EMUL
INTRAVENOUS | Status: DC | PRN
  Administered 2021-10-28: 50 ug/kg/min via INTRAVENOUS

## 2021-10-28 MED ORDER — LACTATED RINGERS IV SOLN: INTRAVENOUS | Status: DC

## 2021-10-28 MED ORDER — ONDANSETRON HCL 4 MG/2ML IJ SOLN
INTRAMUSCULAR | Status: DC | PRN
  Administered 2021-10-28: 4 mg via INTRAVENOUS

## 2021-10-28 MED ORDER — FENTANYL CITRATE (PF) 100 MCG/2ML IJ SOLN
INTRAMUSCULAR | Status: AC
  Filled 2021-10-28: qty 2

## 2021-10-28 MED ORDER — FENTANYL CITRATE (PF) 100 MCG/2ML IJ SOLN
INTRAMUSCULAR | Status: DC | PRN
  Administered 2021-10-28: 50 ug via INTRAVENOUS

## 2021-10-28 MED ORDER — OXYCODONE HCL 5 MG PO TABS: 5.0000 mg | ORAL_TABLET | Freq: Once | ORAL | Status: DC | PRN

## 2021-10-28 MED ORDER — LIDOCAINE HCL (CARDIAC) PF 100 MG/5ML IV SOSY
PREFILLED_SYRINGE | INTRAVENOUS | Status: DC | PRN
  Administered 2021-10-28: 50 mg via INTRAVENOUS

## 2021-10-28 MED ORDER — BUPIVACAINE-EPINEPHRINE (PF) 0.25% -1:200000 IJ SOLN
INTRAMUSCULAR | Status: AC
  Filled 2021-10-28: qty 30

## 2021-10-28 MED ORDER — ACETAMINOPHEN 10 MG/ML IV SOLN
INTRAVENOUS | Status: DC | PRN
  Administered 2021-10-28: 1000 mg via INTRAVENOUS

## 2021-10-28 MED ORDER — CEFAZOLIN SODIUM-DEXTROSE 2-4 GM/100ML-% IV SOLN
2.0000 g | INTRAVENOUS | Status: AC
  Administered 2021-10-28: 2 g via INTRAVENOUS

## 2021-10-28 MED ORDER — PROPOFOL 10 MG/ML IV BOLUS
INTRAVENOUS | Status: DC | PRN
  Administered 2021-10-28: 20 mg via INTRAVENOUS

## 2021-10-28 MED ORDER — PHENYLEPHRINE 80 MCG/ML (10ML) SYRINGE FOR IV PUSH (FOR BLOOD PRESSURE SUPPORT)
PREFILLED_SYRINGE | INTRAVENOUS | Status: DC | PRN
  Administered 2021-10-28: 80 ug via INTRAVENOUS

## 2021-10-28 MED ORDER — ORAL CARE MOUTH RINSE: 15.0000 mL | Freq: Once | OROMUCOSAL | Status: AC

## 2021-10-28 MED ORDER — CHLORHEXIDINE GLUCONATE 0.12 % MT SOLN: 15.0000 mL | Freq: Once | OROMUCOSAL | Status: AC

## 2021-10-28 MED ORDER — 0.9 % SODIUM CHLORIDE (POUR BTL) OPTIME
TOPICAL | Status: DC | PRN
  Administered 2021-10-28: 500 mL

## 2021-10-28 MED ORDER — ACETAMINOPHEN 10 MG/ML IV SOLN
INTRAVENOUS | Status: AC
  Filled 2021-10-28: qty 100

## 2021-10-28 MED ORDER — FAMOTIDINE 20 MG PO TABS
ORAL_TABLET | ORAL | Status: AC
  Administered 2021-10-28: 20 mg via ORAL
  Filled 2021-10-28: qty 1

## 2021-10-28 SURGICAL SUPPLY — 37 items
BLADE SURG 15 STRL LF DISP TIS (BLADE) ×2 IMPLANT
BLADE SURG 15 STRL SS (BLADE) ×2
BLADE SURG MINI STRL (BLADE) ×1 IMPLANT
BNDG ELASTIC 4X5.8 VLCR NS LF (GAUZE/BANDAGES/DRESSINGS) ×1 IMPLANT
BNDG ESMARK 4X12 TAN STRL LF (GAUZE/BANDAGES/DRESSINGS) ×1 IMPLANT
BNDG STRETCH GAUZE 3IN X12FT (GAUZE/BANDAGES/DRESSINGS) ×1 IMPLANT
CUFF TOURN SGL QUICK 12 (TOURNIQUET CUFF) IMPLANT
CUFF TOURN SGL QUICK 18X4 (TOURNIQUET CUFF) IMPLANT
DRAPE FLUOR MINI C-ARM 54X84 (DRAPES) ×1 IMPLANT
DURAPREP 26ML APPLICATOR (WOUND CARE) ×1 IMPLANT
ELECT REM PT RETURN 9FT ADLT (ELECTROSURGICAL) ×1
ELECTRODE REM PT RTRN 9FT ADLT (ELECTROSURGICAL) ×1 IMPLANT
GAUZE SPONGE 4X4 12PLY STRL (GAUZE/BANDAGES/DRESSINGS) ×1 IMPLANT
GAUZE XEROFORM 1X8 LF (GAUZE/BANDAGES/DRESSINGS) ×1 IMPLANT
GLOVE BIO SURGEON STRL SZ7.5 (GLOVE) ×1 IMPLANT
GLOVE SURG UNDER LTX SZ8 (GLOVE) ×1 IMPLANT
GOWN STRL REUS W/ TWL XL LVL3 (GOWN DISPOSABLE) ×2 IMPLANT
GOWN STRL REUS W/TWL XL LVL3 (GOWN DISPOSABLE) ×2
MANIFOLD NEPTUNE II (INSTRUMENTS) ×1 IMPLANT
NDL FILTER BLUNT 18X1 1/2 (NEEDLE) ×1 IMPLANT
NDL HYPO 25X1 1.5 SAFETY (NEEDLE) ×3 IMPLANT
NEEDLE FILTER BLUNT 18X1 1/2 (NEEDLE) ×1 IMPLANT
NEEDLE HYPO 25X1 1.5 SAFETY (NEEDLE) ×3 IMPLANT
NS IRRIG 500ML POUR BTL (IV SOLUTION) ×1 IMPLANT
PACK EXTREMITY ARMC (MISCELLANEOUS) ×1 IMPLANT
PAD CAST 3X4 CTTN HI CHSV (CAST SUPPLIES) ×1 IMPLANT
PADDING CAST COTTON 3X4 STRL (CAST SUPPLIES) ×1
PROBE BIOSP ALOKA ALPHA6 PROST (MISCELLANEOUS) IMPLANT
STOCKINETTE STRL 6IN 960660 (GAUZE/BANDAGES/DRESSINGS) ×1 IMPLANT
STRIP CLOSURE SKIN 1/4X4 (GAUZE/BANDAGES/DRESSINGS) ×1 IMPLANT
SUT VIC AB 4-0 FS2 27 (SUTURE) ×1 IMPLANT
SWABSTK COMLB BENZOIN TINCTURE (MISCELLANEOUS) ×1 IMPLANT
SYR 10ML LL (SYRINGE) ×1 IMPLANT
SYR 20ML LL LF (SYRINGE) ×1 IMPLANT
SYR 3ML LL SCALE MARK (SYRINGE) ×1 IMPLANT
TRAP FLUID SMOKE EVACUATOR (MISCELLANEOUS) ×1 IMPLANT
WATER STERILE IRR 500ML POUR (IV SOLUTION) ×1 IMPLANT

## 2021-10-28 NOTE — Transfer of Care (Signed)
Immediate Anesthesia Transfer of Care Note  Patient: Anthony [name]  Procedure(s) Performed: HARDWARE REMOVAL (Left: Foot)  Patient Location: PACU  Anesthesia Type:General  Level of Consciousness: awake, alert  and oriented  Airway & Oxygen Therapy: Patient Spontanous Breathing and Patient connected to face mask oxygen  Post-op Assessment: Report given to RN and Post -op Vital signs reviewed and stable  Post vital signs: Reviewed and stable  Last Vitals:  Vitals Value Taken Time  BP    Temp    Pulse    Resp    SpO2      Last Pain:  Vitals:   10/28/21 1300  TempSrc: Temporal  PainSc: 0-No pain         Complications: No notable events documented.

## 2021-10-28 NOTE — Discharge Instructions (Addendum)
[facility]  POST OPERATIVE INSTRUCTIONS FOR DR. FOWLER AND [facility] PODIAT[facility]PART[facility]   Take your medication as prescribed.  Pain medication should be taken only as needed.  Keep the dressing clean, dry and intact.  Keep your foot elevated above the heart level for the first 48 hours.  Walking to the bathroom and brief periods of walking are acceptable, unless we have instructed you to be non-weight bearing.  Always wear your post-op shoe when walking.  Always use your crutches if you are to be non-weight bearing.  Do not take a shower. Baths are permissible as long as the foot is kept out of the water.   Every hour you are awake:  Bend your knee 15 times. Flex foot 15 times Massage calf 15 times  Call [facility] ([phone]) if any of the following problems occur: You develop a temperature or fever. The bandage becomes saturated with blood. Medication does not stop your pain. Injury of the foot occurs. Any symptoms of infection including redness, odor, or red streaks running from wound.    AMBULATORY SURGERY  DISCHARGE INSTRUCTIONS   The drugs that you were given will stay in your system until tomorrow so for the next 24 hours you should not:  Drive an automobile Make any legal decisions Drink any alcoholic beverage   You may resume regular meals tomorrow.  Today it is better to start with liquids and gradually work up to solid foods.  You may eat anything you prefer, but it is better to start with liquids, then soup and crackers, and gradually work up to solid foods.   Please notify your doctor immediately if you have any unusual bleeding, trouble breathing, redness and pain at the surgery site, drainage, fever, or pain not relieved by medication.    Additional Instructions:        Please contact your physician with any problems or Same Day Surgery at [phone], Monday through  Friday 6 am to 4 pm, or [place] [place] Main number at [phone]. 

## 2021-10-28 NOTE — H&P (Signed)
HISTORY AND PHYSICAL INTERVAL NOTE:  10/28/2021  2:05 PM  Anthony [name]  has presented today for surgery, with the diagnosis of T84.84XA - Painful orthopaedic hardware M21.622 - Tailor's bunion of left foot.  The various methods of treatment have been discussed with the patient.  No guarantees were given.  After consideration of risks, benefits and other options for treatment, the patient has consented to surgery.  I have reviewed the patients' chart and labs.     A history and physical examination was performed in my office.  The patient was reexamined.  There have been no changes to this history and physical examination.  Samara Deist A

## 2021-10-28 NOTE — Op Note (Signed)
Operative note   Surgeon:Shaniqwa Horsman Lawyer: None    Preop diagnosis: Retained K wire left foot    Postop diagnosis: Same    Procedure: 1.  Removal of K wire plantar lateral left foot. 2.  Intraoperative fluoroscopy use left foot    EBL: Minimal    Anesthesia:local and IV sedation.  Local consisted of a total of 5 cc of 0.25% bupivacaine with epinephrine    Hemostasis: Bupivacaine with epinephrine    Specimen: None    Complications: None    Operative indications:Anthony [name] is an 81 y.o. that presents today for surgical intervention.  The risks/benefits/alternatives/complications have been discussed and consent has been given.    Procedure:  Patient was brought into the OR and placed on the operating table in thesupine position. After anesthesia was obtained theleft lower extremity was prepped and draped in usual sterile fashion.  Attention was directed to the plantar lateral aspect of the left foot.  With use of fluoroscopy the K wire was mapped out.  A small incision was made.  Sharp and blunt dissection carried down deep to the bone and into the subcutaneous tissue.  At this time the K wire was noted to be quite loose.  I was able to remove this.  It was at the level of the bone at removal.  There were K wire was removed in toto.  The wound was flushed with copious amounts of irrigation.  Final fluoroscopy noted complete removal.  The wound was then closed with a 4-0 nylon.  A bulky sterile dressing was applied.    Patient tolerated the procedure and anesthesia well.  Was transported from the OR to the PACU with all vital signs stable and vascular status intact. To be discharged per routine protocol.  Will follow up in approximately 1 week in the outpatient clinic.

## 2021-10-28 NOTE — Anesthesia Postprocedure Evaluation (Signed)
Anesthesia Post Note  Patient: Anthony [name]  Procedure(s) Performed: HARDWARE REMOVAL (Left: Foot)  Patient location during evaluation: PACU Anesthesia Type: General Level of consciousness: awake and alert Pain management: pain level controlled Vital Signs Assessment: post-procedure vital signs reviewed and stable Respiratory status: spontaneous breathing, nonlabored ventilation, respiratory function stable and patient connected to nasal cannula oxygen Cardiovascular status: blood pressure returned to baseline and stable Postop Assessment: no apparent nausea or vomiting Anesthetic complications: no   No notable events documented.   Last Vitals:  Vitals:   10/28/21 1440 10/28/21 1445  BP: 120/68 126/65  Pulse: (!) 58 (!) 53  Resp: 16 12  Temp: (!) 36.3 C   SpO2: 96% 96%    Last Pain:  Vitals:   10/28/21 1445  TempSrc:   PainSc: 0-No pain                 Arita Miss

## 2021-10-28 NOTE — Anesthesia Preprocedure Evaluation (Signed)
Anesthesia Evaluation  Patient identified by MRN, date of birth, ID band Patient awake    Reviewed: Allergy & Precautions, NPO status , Patient's Chart, lab work & pertinent test results  History of Anesthesia Complications Negative for: history of anesthetic complications  Airway Mallampati: III  TM Distance: <3 FB Neck ROM: full    Dental  (+) Chipped, Caps   Pulmonary neg pulmonary ROS, neg shortness of breath, former smoker,    Pulmonary exam normal breath sounds clear to auscultation       Cardiovascular Exercise Tolerance: Good hypertension, + CAD and + Cardiac Stents  + dysrhythmias Atrial Fibrillation  Rhythm:Irregular Rate:Bradycardia - Systolic murmurs Unremarkable echo and stress test in 2017   Neuro/Psych CVA, No Residual Symptoms negative psych ROS   GI/Hepatic negative GI ROS, Neg liver ROS, neg GERD  ,  Endo/Other  Hyperthyroidism   Renal/GU negative Renal ROS  negative genitourinary   Musculoskeletal   Abdominal   Peds  Hematology negative hematology ROS (+)   Anesthesia Other Findings Past Medical History: 07/26/2018: Basal cell carcinoma     Comment:  chest sternum 08/20/2020: Basal cell carcinoma     Comment:  left chest parasternal, EDC 09/25/2020 No date: Cardiomyopathy [facility]) No date: Coronary artery disease     Comment:  a.) PCI 06/1998: stent (unknown type) to pLAD; b.) PCI               06/1999: stent (unknown type) to pRCA; c.) MPI               12/15/2015: EF 57%, no RWMAs, mild inferior wall               perfusion defect 02/2016: CVA (cerebral vascular accident) ([place]) No date: Diverticulosis No date: HLD (hyperlipidemia) No date: Hypertension 10/07/2016: Hyperthyroidism No date: Long term current use of anticoagulant     Comment:  a.) apixaban No date: PAF (paroxysmal atrial fibrillation) (HCC)     Comment:  a.) CHA2DS2VASc = 5 (age x 2, HTN, CVA x 2);  b.)                rate/rhythm maintained on oral carvedilol; chronically               anticoagulated with apixaban 02/18/2016: Subarachnoid hemorrhage following injury (fall) No date: Tailor's bunion of left foot 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 No date: Tubular adenoma No date: Vasovagal syncope  Past Surgical History: 11/11/2017: CHEILECTOMY; Left     Comment:  Procedure: CHEILECTOMY-HALLUX VALGUS AND DESTRUCTION OF               LEISION FIFTH TOE;  Surgeon: Samara Deist, DPM;                Location: [facility] ORS;  Service: Podiatry;  Laterality:               Left; 08/22/2018: COLONOSCOPY; N/A     Comment:  Procedure: COLONOSCOPY;  Surgeon: Lollie Sails,               MD;  Location: [facility] ENDOSCOPY;  Service: Endoscopy;                Laterality: N/A; 10/11/1997: COLONOSCOPY; N/A 12/12/2002: COLONOSCOPY; N/A 04/02/2008: COLONOSCOPY; N/A 07/20/2013: COLONOSCOPY; N/A 06/1998: CORONARY ANGIOPLASTY WITH STENT PLACEMENT; Left     Comment:  Procedure: CORONARY ANGIOPLASTY WITH STENT PLACEMENT               (pLAD); Location: [place]; Surgeon: Isaias Cowman,  MD 06/1999: CORONARY ANGIOPLASTY WITH STENT PLACEMENT; Left     Comment:  Procedure: CORONARY ANGIOPLASTY WITH STENT PLACEMENT               (pRCA); Location: [place]; Surgeon: Isaias Cowman, MD No date: FOOT SURGERY     Comment:  base of big toe No date: TONSILLECTOMY 2011: [place]; Left 2017: TOTAL HIP ARTHROPLASTY; Right  BMI    Body Mass Index: 30.11 kg/m      Reproductive/Obstetrics negative OB ROS                             Anesthesia Physical  Anesthesia Plan  ASA: 3  Anesthesia Plan: General   Post-op Pain Management: Minimal or no pain anticipated   Induction: Intravenous  PONV Risk Score and Plan: 2 and Propofol infusion, TIVA and Ondansetron  Airway Management Planned: Natural Airway and Nasal Cannula  Additional Equipment:   Intra-op Plan:   Post-operative Plan:    Informed Consent: I have reviewed the patients History and Physical, chart, labs and discussed the procedure including the risks, benefits and alternatives for the proposed anesthesia with the patient or authorized representative who has indicated his/her understanding and acceptance.     Dental Advisory Given  Plan Discussed with: Anesthesiologist, CRNA and Surgeon  Anesthesia Plan Comments: (Patient consented for risks of anesthesia including but not limited to:  - adverse reactions to medications - risk of airway placement if required - damage to eyes, teeth, lips or other oral mucosa - nerve damage due to positioning  - sore throat or hoarseness - Damage to heart, brain, nerves, lungs, other parts of body or loss of life  Patient voiced understanding.)        Anesthesia Quick Evaluation

## 2021-10-29 ENCOUNTER — Encounter: Payer: Self-pay | Admitting: Podiatry

## 2022-09-08 ENCOUNTER — Ambulatory Visit: Payer: Medicare Other | Admitting: Dermatology

## 2022-10-18 ENCOUNTER — Ambulatory Visit: Payer: Medicare Other | Admitting: Dermatology

## 2022-10-18 DIAGNOSIS — Z1283 Encounter for screening for malignant neoplasm of skin: Secondary | ICD-10-CM

## 2022-10-18 DIAGNOSIS — L820 Inflamed seborrheic keratosis: Secondary | ICD-10-CM | POA: Diagnosis not present

## 2022-10-18 DIAGNOSIS — L814 Other melanin hyperpigmentation: Secondary | ICD-10-CM | POA: Diagnosis not present

## 2022-10-18 DIAGNOSIS — L570 Actinic keratosis: Secondary | ICD-10-CM | POA: Diagnosis not present

## 2022-10-18 DIAGNOSIS — L578 Other skin changes due to chronic exposure to nonionizing radiation: Secondary | ICD-10-CM

## 2022-10-18 DIAGNOSIS — W908XXA Exposure to other nonionizing radiation, initial encounter: Secondary | ICD-10-CM

## 2022-10-18 DIAGNOSIS — D1801 Hemangioma of skin and subcutaneous tissue: Secondary | ICD-10-CM

## 2022-10-18 DIAGNOSIS — L821 Other seborrheic keratosis: Secondary | ICD-10-CM

## 2022-10-18 DIAGNOSIS — Z85828 Personal history of other malignant neoplasm of skin: Secondary | ICD-10-CM

## 2022-10-18 NOTE — Patient Instructions (Addendum)

## 2022-10-18 NOTE — Progress Notes (Signed)
Follow-Up Visit   Subjective  Anthony [name] is a 82 y.o. male who presents for the following: Skin Cancer Screening and Full Body Skin Exam, hx of BCC  The patient presents for Total-Body Skin Exam (TBSE) for skin cancer screening and mole check. The patient has spots, moles and lesions to be evaluated, some may be new or changing and the patient may have concern these could be cancer.   The following portions of the chart were reviewed this encounter and updated as appropriate: medications, allergies, medical history  Review of Systems:  No other skin or systemic complaints except as noted in HPI or Assessment and Plan.  Objective  Well appearing patient in no apparent distress; mood and affect are within normal limits.  A full examination was performed including scalp, head, eyes, ears, nose, lips, neck, chest, axillae, abdomen, back, buttocks, bilateral upper extremities, bilateral lower extremities, hands, feet, fingers, toes, fingernails, and toenails. All findings within normal limits unless otherwise noted below.   Relevant physical exam findings are noted in the Assessment and Plan.  face x 5 (5) Erythematous thin papules/macules with gritty scale.   right forearm x 1, neck,back x 23, left leg x 1 (25) Stuck-on, waxy, tan-brown papules -- Discussed benign etiology and prognosis.     Assessment & Plan   SKIN CANCER SCREENING PERFORMED TODAY.  ACTINIC DAMAGE - Chronic condition, secondary to cumulative UV/sun exposure - diffuse scaly erythematous macules with underlying dyspigmentation - Recommend daily broad spectrum sunscreen SPF 30+ to sun-exposed areas, reapply every 2 hours as needed.  - Staying in the shade or wearing long sleeves, sun glasses (UVA+UVB protection) and wide brim hats (4-inch brim around the entire circumference of the hat) are also recommended for sun protection.  - Call for new or changing lesions.  LENTIGINES, SEBORRHEIC KERATOSES,  HEMANGIOMAS - Benign normal skin lesions - Benign-appearing - Call for any changes  MELANOCYTIC NEVI - Tan-brown and/or pink-flesh-colored symmetric macules and papules - Benign appearing on exam today - Observation - Call clinic for new or changing moles - Recommend daily use of broad spectrum spf 30+ sunscreen to sun-exposed areas.   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  - Call if any new or changing lesions are noted between office visits    AK (actinic keratosis) (5) face x 5  Actinic keratoses are precancerous spots that appear secondary to cumulative UV radiation exposure/sun exposure over time. They are chronic with expected duration over 1 year. A portion of actinic keratoses will progress to squamous cell carcinoma of the skin. It is not possible to reliably predict which spots will progress to skin cancer and so treatment is recommended to prevent development of skin cancer.  Recommend daily broad spectrum sunscreen SPF 30+ to sun-exposed areas, reapply every 2 hours as needed.  Recommend staying in the shade or wearing long sleeves, sun glasses (UVA+UVB protection) and wide brim hats (4-inch brim around the entire circumference of the hat). Call for new or changing lesions.   Destruction of lesion - face x 5 (5) Complexity: simple   Destruction method: cryotherapy   Informed consent: discussed and consent obtained   Timeout:  patient name, date of birth, surgical site, and procedure verified Lesion destroyed using liquid nitrogen: Yes  Region frozen until ice ball extended beyond lesion: Yes   Outcome: patient tolerated procedure well with no complications   Post-procedure details: wound care instructions given    Inflamed seborrheic  keratosis (25) right forearm x 1, neck,back x 23, left leg x 1  Symptomatic, irritating, patient would like treated.   Destruction of lesion - right forearm x 1, neck,back x 23, left leg x 1 (25) Complexity: simple   Destruction method: cryotherapy   Informed consent: discussed and consent obtained   Timeout:  patient name, date of birth, surgical site, and procedure verified Lesion destroyed using liquid nitrogen: Yes   Region frozen until ice ball extended beyond lesion: Yes   Outcome: patient tolerated procedure well with no complications   Post-procedure details: wound care instructions given     Return in about 1 year (around 10/18/2023) for TBSE, hx of BCC.  IAngelique Holm, CMA, am acting as scribe for Armida Sans, MD .   Documentation: I have reviewed the above documentation for accuracy and completeness, and I agree with the above.  Armida Sans, MD

## 2022-10-22 ENCOUNTER — Encounter: Payer: Self-pay | Admitting: Dermatology

## 2023-05-18 ENCOUNTER — Ambulatory Visit: Payer: Medicare Other | Admitting: Dermatology

## 2023-07-04 ENCOUNTER — Encounter: Payer: Self-pay | Admitting: Dermatology

## 2023-07-04 ENCOUNTER — Ambulatory Visit: Admitting: Dermatology

## 2023-07-04 DIAGNOSIS — Z7189 Other specified counseling: Secondary | ICD-10-CM

## 2023-07-04 DIAGNOSIS — L814 Other melanin hyperpigmentation: Secondary | ICD-10-CM | POA: Diagnosis not present

## 2023-07-04 DIAGNOSIS — L820 Inflamed seborrheic keratosis: Secondary | ICD-10-CM

## 2023-07-04 DIAGNOSIS — L821 Other seborrheic keratosis: Secondary | ICD-10-CM

## 2023-07-04 DIAGNOSIS — D225 Melanocytic nevi of trunk: Secondary | ICD-10-CM | POA: Diagnosis not present

## 2023-07-04 DIAGNOSIS — D229 Melanocytic nevi, unspecified: Secondary | ICD-10-CM

## 2023-07-04 DIAGNOSIS — L700 Acne vulgaris: Secondary | ICD-10-CM

## 2023-07-04 DIAGNOSIS — Z85828 Personal history of other malignant neoplasm of skin: Secondary | ICD-10-CM

## 2023-07-04 DIAGNOSIS — L729 Follicular cyst of the skin and subcutaneous tissue, unspecified: Secondary | ICD-10-CM

## 2023-07-04 DIAGNOSIS — L578 Other skin changes due to chronic exposure to nonionizing radiation: Secondary | ICD-10-CM

## 2023-07-04 DIAGNOSIS — L570 Actinic keratosis: Secondary | ICD-10-CM | POA: Diagnosis not present

## 2023-07-04 DIAGNOSIS — W908XXA Exposure to other nonionizing radiation, initial encounter: Secondary | ICD-10-CM

## 2023-07-04 DIAGNOSIS — Z1283 Encounter for screening for malignant neoplasm of skin: Secondary | ICD-10-CM

## 2023-07-04 DIAGNOSIS — Z872 Personal history of diseases of the skin and subcutaneous tissue: Secondary | ICD-10-CM

## 2023-07-04 NOTE — Progress Notes (Signed)
 Follow-Up Visit   Subjective  Anthony [name] is a 83 y.o. male who presents for the following: Skin Cancer Screening and Upper Body Skin Exam  hx of bcc, hx of aks, hx of isks  Patient reports some spots at back, arms, legs, upper chest and face  The patient presents for Upper Body Skin Exam (UBSE) for skin cancer screening and mole check. The patient has spots, moles and lesions to be evaluated, some may be new or changing and the patient may have concern these could be cancer.  The following portions of the chart were reviewed this encounter and updated as appropriate: medications, allergies, medical history  Review of Systems:  No other skin or systemic complaints except as noted in HPI or Assessment and Plan.  Objective  Well appearing patient in no apparent distress; mood and affect are within normal limits.  All skin waist up examined. Relevant physical exam findings are noted in the Assessment and Plan.  back x 14, right arm x 2, scalp x 1, left thigh x 1, left popliteal x 1 (19) Erythematous stuck-on, waxy papule or plaque Neck - Anterior x 1, forehead x 2, x scalp x 1 (4) Erythematous thin papules/macules with gritty scale.   Assessment & Plan   INFLAMED SEBORRHEIC KERATOSIS (19) back x 14, right arm x 2, scalp x 1, left thigh x 1, left popliteal x 1 (19) Symptomatic, irritating, patient would like treated. Destruction of lesion - back x 14, right arm x 2, scalp x 1, left thigh x 1, left popliteal x 1 (19) Complexity: simple   Destruction method: cryotherapy   Informed consent: discussed and consent obtained   Timeout:  patient name, date of birth, surgical site, and procedure verified Lesion destroyed using liquid nitrogen: Yes   Region frozen until ice ball extended beyond lesion: Yes   Outcome: patient tolerated procedure well with no complications   Post-procedure details: wound care instructions given     ACTINIC KERATOSIS (4) Neck - Anterior x  1, forehead x 2, x scalp x 1 (4) Actinic keratoses are precancerous spots that appear secondary to cumulative UV radiation exposure/sun exposure over time. They are chronic with expected duration over 1 year. A portion of actinic keratoses will progress to squamous cell carcinoma of the skin. It is not possible to reliably predict which spots will progress to skin cancer and so treatment is recommended to prevent development of skin cancer.  Recommend daily broad spectrum sunscreen SPF 30+ to sun-exposed areas, reapply every 2 hours as needed.  Recommend staying in the shade or wearing long sleeves, sun glasses (UVA+UVB protection) and wide brim hats (4-inch brim around the entire circumference of the hat). Call for new or changing lesions. Destruction of lesion - Neck - Anterior x 1, forehead x 2, x scalp x 1 (4) Complexity: simple   Destruction method: cryotherapy   Informed consent: discussed and consent obtained   Timeout:  patient name, date of birth, surgical site, and procedure verified Lesion destroyed using liquid nitrogen: Yes   Region frozen until ice ball extended beyond lesion: Yes   Outcome: patient tolerated procedure well with no complications   Post-procedure details: wound care instructions given   Skin cancer screening performed today.  Actinic Damage - Chronic condition, secondary to cumulative UV/sun exposure - diffuse scaly erythematous macules with underlying dyspigmentation - Recommend daily broad spectrum sunscreen SPF 30+ to sun-exposed areas, reapply every 2 hours as needed.  - Staying  in the shade or wearing long sleeves, sun glasses (UVA+UVB protection) and wide brim hats (4-inch brim around the entire circumference of the hat) are also recommended for sun protection.  - Call for new or changing lesions.  Lentigines, Seborrheic Keratoses, Hemangiomas - Benign normal skin lesions - Benign-appearing - Call for any changes  Melanocytic Nevi - Tan-brown and/or  pink-flesh-colored symmetric macules and papules - Benign appearing on exam today - Observation - Call clinic for new or changing moles - Recommend daily use of broad spectrum spf 30+ sunscreen to sun-exposed areas.   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  Observation.  Call clinic for new or changing lesions.  Recommend daily use of broad spectrum spf 30+ sunscreen to sun-exposed areas.    Open comedones at back and chest Exam: Open comedones at back and chest Treatment Plan: Do not recommend treatment Discussed would need to be cut out to removed completely  Benign-appearing.  Observation.  Call clinic for new or changing lesions.  Recommend daily use of broad spectrum spf 30+ sunscreen to sun-exposed areas.   HISTORY OF BASAL CELL CARCINOMA OF THE SKIN 07/26/2018 chest sternum 08/20/2020 left chest parasternal - ED&C 09/25/2020  - No evidence of recurrence today - Recommend regular full body skin exams - Recommend daily broad spectrum sunscreen SPF 30+ to sun-exposed areas, reapply every 2 hours as needed.  - Call if any new or changing lesions are noted between office visits   Return for reschedule appt in october 1 year tbse.  IEleanor Blush, CMA, am acting as scribe for Alm Rhyme, MD.   Documentation: I have reviewed the above documentation for accuracy and completeness, and I agree with the above.  Alm Rhyme, MD

## 2023-07-04 NOTE — Patient Instructions (Addendum)
Actinic keratoses are precancerous spots that appear secondary to cumulative UV radiation exposure/sun exposure over time. They are chronic with expected duration over 1 year. A portion of actinic keratoses will progress to squamous cell carcinoma of the skin. It is not possible to reliably predict which spots will progress to skin cancer and so treatment is recommended to prevent development of skin cancer.  Recommend daily broad spectrum sunscreen SPF 30+ to sun-exposed areas, reapply every 2 hours as needed.  Recommend staying in the shade or wearing long sleeves, sun glasses (UVA+UVB protection) and wide brim hats (4-inch brim around the entire circumference of the hat). Call for new or changing lesions.     Cryotherapy Aftercare  Wash gently with soap and water everyday.   Apply Vaseline and Band-Aid daily until healed.     Seborrheic Keratosis  What causes seborrheic keratoses? Seborrheic keratoses are harmless, common skin growths that first appear during adult life.  As time goes by, more growths appear.  Some people may develop a large number of them.  Seborrheic keratoses appear on both covered and uncovered body parts.  They are not caused by sunlight.  The tendency to develop seborrheic keratoses can be inherited.  They vary in color from skin-colored to gray, brown, or even black.  They can be either smooth or have a rough, warty surface.   Seborrheic keratoses are superficial and look as if they were stuck on the skin.  Under the microscope this type of keratosis looks like layers upon layers of skin.  That is why at times the top layer may seem to fall off, but the rest of the growth remains and re-grows.    Treatment Seborrheic keratoses do not need to be treated, but can easily be removed in the office.  Seborrheic keratoses often cause symptoms when they rub on clothing or jewelry.  Lesions can be in the way of shaving.  If they become inflamed, they can cause itching,  soreness, or burning.  Removal of a seborrheic keratosis can be accomplished by freezing, burning, or surgery. If any spot bleeds, scabs, or grows rapidly, please return to have it checked, as these can be an indication of a skin cancer.    Due to recent changes in healthcare laws, you may see results of your pathology and/or laboratory studies on MyChart before the doctors have had a chance to review them. We understand that in some cases there may be results that are confusing or concerning to you. Please understand that not all results are received at the same time and often the doctors may need to interpret multiple results in order to provide you with the best plan of care or course of treatment. Therefore, we ask that you please give [place] 2 business days to thoroughly review all your results before contacting the office for clarification. Should we see a critical lab result, you will be contacted sooner.   If You Need Anything After Your Visit  If you have any questions or concerns for your doctor, please call our main line at [phone] and press option 4 to reach your doctor's medical assistant. If no one answers, please leave a voicemail as directed and we will return your call as soon as possible. Messages left after 4 pm will be answered the following business day.   You may also send [place] a message via MyChart. We typically respond to MyChart messages within 1-2 business days.  For prescription refills, please ask your pharmacy to contact  our office. Our fax number is [phone].  If you have an urgent issue when the clinic is closed that cannot wait until the next business day, you can page your doctor at the number below.    Please note that while we do our best to be available for urgent issues outside of office hours, we are not available 24/7.   If you have an urgent issue and are unable to reach [place], you may choose to seek medical care at your doctor's office, retail clinic,  urgent care center, or emergency room.  If you have a medical emergency, please immediately call 911 or go to the emergency department.  Pager Numbers  - Dr. Gwen Pounds: [phone]  - Dr. Roseanne Reno: [phone]  - Dr. Katrinka Blazing: [phone]   In the event of inclement weather, please call our main line at [phone] for an update on the status of any delays or closures.  Dermatology Medication Tips: Please keep the boxes that topical medications come in in order to help keep track of the instructions about where and how to use these. Pharmacies typically print the medication instructions only on the boxes and not directly on the medication tubes.   If your medication is too expensive, please contact our office at [phone] option 4 or send [place] a message through MyChart.   We are unable to tell what your co-pay for medications will be in advance as this is different depending on your insurance coverage. However, we may be able to find a substitute medication at lower cost or fill out paperwork to get insurance to cover a needed medication.   If a prior authorization is required to get your medication covered by your insurance company, please allow [place] 1-2 business days to complete this process.  Drug prices often vary depending on where the prescription is filled and some pharmacies may offer cheaper prices.  The website www.goodrx.com contains coupons for medications through different pharmacies. The prices here do not account for what the cost may be with help from insurance (it may be cheaper with your insurance), but the website can give you the price if you did not use any insurance.  - You can print the associated coupon and take it with your prescription to the pharmacy.  - You may also stop by our office during regular business hours and pick up a GoodRx coupon card.  - If you need your prescription sent electronically to a different pharmacy, notify our office through [facility] or by phone at [phone] option 4.     Si Usted Necesita Algo Despus de Su Visita  Tambin puede enviarnos un mensaje a travs de Clinical cytogeneticist. Por lo general respondemos a los mensajes de MyChart en el transcurso de 1 a 2 das hbiles.  Para renovar recetas, por favor pida a su farmacia que se ponga en contacto con nuestra oficina. Annie Sable de fax es [place] [phone].  Si tiene un asunto urgente cuando la clnica est cerrada y que no puede esperar hasta el siguiente da hbil, puede llamar/localizar a su doctor(a) al nmero que aparece a continuacin.   Por favor, tenga en cuenta que aunque hacemos todo lo posible para estar disponibles para asuntos urgentes fuera del horario de [place], no estamos disponibles las 24 horas del da, los 7 [address]  [address] de la [place].   Si tiene un problema urgente y no puede comunicarse con nosotros, puede optar por buscar atencin mdica  en el consultorio de su doctor(a), en una clnica  privada, en un centro de atencin urgente o en una sala de emergencias.  Si tiene Engineer, drilling, por favor llame inmediatamente al 911 o vaya a la sala de emergencias.  Nmeros de bper  - Dr. Gwen Pounds: [phone]  - Dra. Roseanne Reno: [phone]  - Dr. Katrinka Blazing: [phone]   En caso de inclemencias del tiempo, por favor llame a Lacy Duverney principal al [phone] para una actualizacin sobre el [place] de cualquier retraso o cierre.  Consejos para la medicacin en dermatologa: Por favor, guarde las cajas en las que vienen los medicamentos de uso tpico para ayudarle a seguir las instrucciones sobre dnde y cmo usarlos. Las farmacias generalmente imprimen las instrucciones del medicamento slo en las cajas y no directamente en los tubos del [place].   Si su medicamento es muy caro, por favor, pngase en contacto con Rolm Gala llamando al [phone] y presione la opcin 4 o envenos un mensaje a travs de Clinical cytogeneticist.   No podemos decirle cul  ser su copago por los medicamentos por adelantado ya que esto es diferente dependiendo de la cobertura de su seguro. Sin embargo, es posible que podamos encontrar un medicamento sustituto a Audiological scientist un formulario para que el seguro cubra el medicamento que se considera necesario.   Si se requiere una autorizacin previa para que su compaa de seguros [place] su medicamento, por favor permtanos de 1 a 2 das hbiles para completar [address]  Los precios de los medicamentos varan con frecuencia dependiendo del Environmental consultant de dnde se surte la receta y alguna farmacias pueden ofrecer precios ms baratos.  El sitio web www.goodrx.com tiene cupones para medicamentos de Health and safety inspector. Los precios aqu no tienen en cuenta lo que podra costar con la ayuda del seguro (puede ser ms barato con su seguro), pero el sitio web puede darle el precio si no utiliz Tourist information centre manager.  - Puede imprimir el cupn correspondiente y llevarlo con su receta a la farmacia.  - Tambin puede pasar por nuestra oficina durante el horario de atencin regular y Education officer, museum una tarjeta de cupones de GoodRx.  - Si necesita que su receta se enve electrnicamente a una farmacia diferente, informe a nuestra oficina a travs de MyChart de [place] o por telfono llamando al [phone] y presione la opcin 4.

## 2023-09-14 ENCOUNTER — Encounter: Payer: Self-pay | Admitting: *Deleted

## 2023-10-04 ENCOUNTER — Ambulatory Visit
Admission: RE | Admit: 2023-10-04 | Discharge: 2023-10-04 | Disposition: A | Discharge status: Home / Self Care | Attending: Gastroenterology | Admitting: Gastroenterology

## 2023-10-04 ENCOUNTER — Ambulatory Visit

## 2023-10-04 ENCOUNTER — Other Ambulatory Visit: Payer: Self-pay

## 2023-10-04 ENCOUNTER — Encounter
Admission: RE | Disposition: A | Discharge status: Home / Self Care | Payer: Self-pay | Source: Home / Self Care | Attending: Gastroenterology

## 2023-10-04 DIAGNOSIS — Z87891 Personal history of nicotine dependence: Secondary | ICD-10-CM | POA: Diagnosis not present

## 2023-10-04 DIAGNOSIS — E785 Hyperlipidemia, unspecified: Secondary | ICD-10-CM | POA: Insufficient documentation

## 2023-10-04 DIAGNOSIS — K5730 Diverticulosis of large intestine without perforation or abscess without bleeding: Secondary | ICD-10-CM | POA: Diagnosis not present

## 2023-10-04 DIAGNOSIS — K640 First degree hemorrhoids: Secondary | ICD-10-CM | POA: Insufficient documentation

## 2023-10-04 DIAGNOSIS — Z7901 Long term (current) use of anticoagulants: Secondary | ICD-10-CM | POA: Insufficient documentation

## 2023-10-04 DIAGNOSIS — D122 Benign neoplasm of ascending colon: Secondary | ICD-10-CM | POA: Diagnosis not present

## 2023-10-04 DIAGNOSIS — D120 Benign neoplasm of cecum: Secondary | ICD-10-CM | POA: Insufficient documentation

## 2023-10-04 DIAGNOSIS — I2510 Atherosclerotic heart disease of native coronary artery without angina pectoris: Secondary | ICD-10-CM | POA: Diagnosis not present

## 2023-10-04 DIAGNOSIS — Z800 Family history of malignant neoplasm of digestive organs: Secondary | ICD-10-CM | POA: Diagnosis not present

## 2023-10-04 DIAGNOSIS — E0590 Thyrotoxicosis, unspecified without thyrotoxic crisis or storm: Secondary | ICD-10-CM | POA: Insufficient documentation

## 2023-10-04 DIAGNOSIS — Z8673 Personal history of transient ischemic attack (TIA), and cerebral infarction without residual deficits: Secondary | ICD-10-CM | POA: Insufficient documentation

## 2023-10-04 DIAGNOSIS — Z1211 Encounter for screening for malignant neoplasm of colon: Secondary | ICD-10-CM | POA: Diagnosis present

## 2023-10-04 DIAGNOSIS — I10 Essential (primary) hypertension: Secondary | ICD-10-CM | POA: Diagnosis not present

## 2023-10-04 HISTORY — PX: POLYPECTOMY: SHX149

## 2023-10-04 HISTORY — PX: COLONOSCOPY: SHX5424

## 2023-10-04 HISTORY — DX: Personal history of adenomatous and serrated colon polyps: Z86.0101

## 2023-10-04 SURGERY — COLONOSCOPY
Anesthesia: General

## 2023-10-04 MED ORDER — LIDOCAINE HCL (CARDIAC) PF 100 MG/5ML IV SOSY
PREFILLED_SYRINGE | INTRAVENOUS | Status: DC | PRN
  Administered 2023-10-04: 50 mg via INTRAVENOUS

## 2023-10-04 MED ORDER — SODIUM CHLORIDE 0.9 % IV SOLN: INTRAVENOUS | Status: DC

## 2023-10-04 MED ORDER — PROPOFOL 10 MG/ML IV BOLUS
INTRAVENOUS | Status: DC | PRN
  Administered 2023-10-04 (×2): 20 mg via INTRAVENOUS
  Administered 2023-10-04: 70 mg via INTRAVENOUS
  Administered 2023-10-04: 10 mg via INTRAVENOUS
  Administered 2023-10-04: 20 mg via INTRAVENOUS

## 2023-10-04 MED ORDER — STERILE WATER FOR IRRIGATION IR SOLN
Status: DC | PRN
  Administered 2023-10-04: .06 mL

## 2023-10-04 NOTE — Transfer of Care (Signed)
 Immediate Anesthesia Transfer of Care Note  Patient: Anthony [name]  Procedure(s) Performed: COLONOSCOPY POLYPECTOMY, INTESTINE  Patient Location: PACU  Anesthesia Type:General  Level of Consciousness: drowsy  Airway & Oxygen Therapy: Patient Spontanous Breathing  Post-op Assessment: Report given to RN and Post -op Vital signs reviewed and stable  Post vital signs: Reviewed and stable  Last Vitals:  Vitals Value Taken Time  BP    Temp    Pulse    Resp    SpO2      Last Pain:  Vitals:   10/04/23 0931  TempSrc: Temporal  PainSc: 0-No pain         Complications: No notable events documented.

## 2023-10-04 NOTE — Anesthesia Preprocedure Evaluation (Signed)
 Anesthesia Evaluation  Patient identified by MRN, date of birth, ID band Patient awake    Reviewed: Allergy & Precautions, H&P , NPO status , Patient's Chart, lab work & pertinent test results, reviewed documented beta blocker date and time   Airway Mallampati: II   Neck ROM: full    Dental  (+) Poor Dentition   Pulmonary neg pulmonary ROS, former smoker   Pulmonary exam normal        Cardiovascular Exercise Tolerance: Poor hypertension, On Medications + CAD  Normal cardiovascular exam Rhythm:regular Rate:Normal     Neuro/Psych CVA  negative psych ROS   GI/Hepatic negative GI ROS, Neg liver ROS,,,  Endo/Other   Hyperthyroidism   Renal/GU negative Renal ROS  negative genitourinary   Musculoskeletal   Abdominal   Peds  Hematology negative hematology ROS (+)   Anesthesia Other Findings Past Medical History: 07/26/2018: Basal cell carcinoma     Comment:  chest sternum 08/20/2020: Basal cell carcinoma     Comment:  left chest parasternal, EDC 09/25/2020 No date: Cardiomyopathy [facility]) No date: Coronary artery disease     Comment:  a.) PCI 06/1998: stent (unknown type) to pLAD; b.) PCI               06/1999: stent (unknown type) to pRCA; c.) MPI               12/15/2015: EF 57%, no RWMAs, mild inferior wall               perfusion defect 02/2016: CVA (cerebral vascular accident) (HCC) No date: Diverticulosis No date: History of adenomatous polyp of colon No date: HLD (hyperlipidemia) No date: Hypertension 10/07/2016: Hyperthyroidism No date: Long term current use of anticoagulant     Comment:  a.) apixaban No date: PAF (paroxysmal atrial fibrillation) (HCC)     Comment:  a.) CHA2DS2VASc = 5 (age x 2, HTN, CVA x 2);  b.)               rate/rhythm maintained on oral carvedilol; chronically               anticoagulated with apixaban 02/18/2016: Subarachnoid hemorrhage following injury (fall) No date: 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 No date: Tubular adenoma No date: Vasovagal syncope Past Surgical History: No date: ADENOIDECTOMY; N/A 11/11/2017: CHEILECTOMY; Left     Comment:  Procedure: CHEILECTOMY-HALLUX VALGUS AND DESTRUCTION OF               LEISION FIFTH TOE;  Surgeon: Ashley Soulier, DPM;                Location: [facility] ORS;  Service: Podiatry;  Laterality:               Left; 08/22/2018: COLONOSCOPY; N/A     Comment:  Procedure: COLONOSCOPY;  Surgeon: Gaylyn Gladis PENNER,               MD;  Location: [facility] ENDOSCOPY;  Service: Endoscopy;                Laterality: N/A; 10/11/1997: COLONOSCOPY; N/A 12/12/2002: COLONOSCOPY; N/A 04/02/2008: COLONOSCOPY; N/A 07/20/2013: COLONOSCOPY; N/A 06/1998: CORONARY ANGIOPLASTY WITH STENT PLACEMENT; Left     Comment:  Procedure: CORONARY ANGIOPLASTY WITH STENT PLACEMENT               (pLAD); Location: [facility]; Surgeon: Marsa Dooms, MD 06/1999: CORONARY ANGIOPLASTY WITH STENT PLACEMENT; Left     Comment:  Procedure: CORONARY ANGIOPLASTY WITH STENT  PLACEMENT               (pRCA); Location: [facility]; Surgeon: Marsa Dooms, MD No date: FOOT SURGERY     Comment:  base of big toe 10/28/2021: HARDWARE REMOVAL; Left     Comment:  Procedure: HARDWARE REMOVAL;  Surgeon: Ashley Soulier,               DPM;  Location: [facility] ORS;  Service: Podiatry;                Laterality: Left; No date: JOINT REPLACEMENT No date: REMOVAL OF BASELCELL ON CHEST; N/A No date: TONSILLECTOMY 2011: TOTAL HIP ARTHROPLASTY; Left 2017: TOTAL HIP ARTHROPLASTY; Right 09/18/2021: WEIL OSTEOTOMY; Left     Comment:  Procedure: TAILOR'S BUNION;  Surgeon: Ashley Soulier,               DPM;  Location: [facility] ORS;  Service: Podiatry;                Laterality: Left; BMI    Body Mass Index: 29.16 kg/m     Reproductive/Obstetrics negative OB ROS                              Anesthesia Physical Anesthesia Plan  ASA:  3  Anesthesia Plan: General   Post-op Pain Management:    Induction:   PONV Risk Score and Plan:   Airway Management Planned:   Additional Equipment:   Intra-op Plan:   Post-operative Plan:   Informed Consent: I have reviewed the patients History and Physical, chart, labs and discussed the procedure including the risks, benefits and alternatives for the proposed anesthesia with the patient or authorized representative who has indicated his/her understanding and acceptance.     Dental Advisory Given  Plan Discussed with: CRNA  Anesthesia Plan Comments:         Anesthesia Quick Evaluation

## 2023-10-04 NOTE — Anesthesia Postprocedure Evaluation (Signed)
 Anesthesia Post Note  Patient: Anthony [name]  Procedure(s) Performed: COLONOSCOPY POLYPECTOMY, INTESTINE  Patient location during evaluation: PACU Anesthesia Type: General Level of consciousness: awake and alert Pain management: pain level controlled Vital Signs Assessment: post-procedure vital signs reviewed and stable Respiratory status: spontaneous breathing, nonlabored ventilation, respiratory function stable and patient connected to nasal cannula oxygen Cardiovascular status: blood pressure returned to baseline and stable Postop Assessment: no apparent nausea or vomiting Anesthetic complications: no   No notable events documented.   Last Vitals:  Vitals:   10/04/23 1026 10/04/23 1036  BP: 115/67 134/66  Pulse: 68 75  Resp: 17 14  Temp: (!) 36.1 C (!) 36.1 C  SpO2: 97% 100%    Last Pain:  Vitals:   10/04/23 1036  TempSrc: Temporal  PainSc: 0-No pain                 Lynwood [name]

## 2023-10-04 NOTE — Interval H&P Note (Signed)
 History and Physical Interval Note:  10/04/2023 10:04 AM  Anthony [name]  has presented today for surgery, with the diagnosis of hx of adenomatous polyp of colon.  The various methods of treatment have been discussed with the patient and family. After consideration of risks, benefits and other options for treatment, the patient has consented to  Procedure(s): COLONOSCOPY (N/A) as a surgical intervention.  The patient's history has been reviewed, patient examined, no change in status, stable for surgery.  I have reviewed the patient's chart and labs.  Questions were answered to the patient's satisfaction.     Ole ONEIDA Schick  Ok to proceed with colonoscopy

## 2023-10-04 NOTE — H&P (Signed)
 Outpatient short stay form Pre-procedure 10/04/2023  Anthony [name] Schick, MD  Primary Physician: Alla Amis, MD  Reason for visit:  Surveillance  History of present illness:    83 y/o gentleman with history of a. Fib on eliquis with last dose 3 days ago, hypertension, and HLD here for surveillance colonoscopy. Last colonoscopy in 2020 with small Ta's. No significant abdominal surgeries. 2nd degree relatives with colon cancer.    Current Facility-Administered Medications:    0.9 %  sodium chloride  infusion, , Intravenous, Continuous, Vergia Chea, Anthony ONEIDA, MD, Last Rate: 20 mL/hr at 10/04/23 0934, New Bag at 10/04/23 0934  Medications Prior to Admission  Medication Sig Dispense Refill Last Dose/Taking   carvedilol (COREG) 12.5 MG tablet Take 12.5 mg by mouth 2 (two) times daily with a meal.   10/04/2023 at  7:30 AM   CORAL CALCIUM PO Take 1 tablet by mouth daily.   10/03/2023   losartan (COZAAR) 50 MG tablet Take 50 mg by mouth daily.   10/04/2023 at  7:30 AM   methimazole (TAPAZOLE) 5 MG tablet Take 2.5 mg by mouth daily.   10/04/2023 at  7:30 AM   Multiple Vitamins-Minerals (MULTIVITAMIN PO) Take 1 tablet by mouth daily.   10/03/2023   simvastatin (ZOCOR) 20 MG tablet Take 20 mg by mouth daily.   10/03/2023   Zinc Acetate 25 MG CAPS Take 25 mg by mouth daily.   10/03/2023   acetaminophen  (TYLENOL ) 650 MG CR tablet Take 650 mg by mouth daily.      amoxicillin (AMOXIL) 500 MG tablet Dental procedures (Patient not taking: Reported on 09/09/2021)      apixaban (ELIQUIS) 5 MG TABS tablet Take 5 mg by mouth 2 (two) times daily.   10/01/2023   aspirin EC 81 MG tablet Take 81 mg by mouth daily. (Patient not taking: Reported on 10/04/2023)   Not Taking   EPINEPHrine  0.3 mg/0.3 mL IJ SOAJ injection Inject 0.3 mg into the muscle as needed for anaphylaxis.      hydrochlorothiazide (MICROZIDE) 12.5 MG capsule Take 12.5 mg by mouth daily. (Patient not taking: Reported on 10/04/2023)   Not Taking      Allergies  Allergen Reactions   Hornet Venom Swelling     Past Medical History:  Diagnosis Date   Basal cell carcinoma 07/26/2018   chest sternum   Basal cell carcinoma 08/20/2020   left chest parasternal, EDC 09/25/2020   Cardiomyopathy (HCC)    Coronary artery disease    a.) PCI 06/1998: stent (unknown type) to pLAD; b.) PCI 06/1999: stent (unknown type) to pRCA; c.) MPI 12/15/2015: EF 57%, no RWMAs, mild inferior wall perfusion defect   CVA (cerebral vascular accident) (HCC) 02/2016   Diverticulosis    History of adenomatous polyp of colon    HLD (hyperlipidemia)    Hypertension    Hyperthyroidism 10/07/2016   Long term current use of anticoagulant    a.) apixaban   PAF (paroxysmal atrial fibrillation) (HCC)    a.) CHA2DS2VASc = 5 (age x 2, HTN, CVA x 2);  b.) rate/rhythm maintained on oral carvedilol; chronically anticoagulated with apixaban   Subarachnoid hemorrhage following injury (fall) 02/18/2016   Tailor's bunion of left foot    Toxic nodular goiter 1996   Tubular adenoma    Vasovagal syncope     Review of systems:  Otherwise negative.    Physical Exam  Gen: Alert, oriented. Appears stated age.  HEENT: PERRLA. Lungs: No respiratory distress CV: RRR Abd: soft, benign, no  masses Ext: No edema    Planned procedures: Proceed with colonoscopy. The patient understands the nature of the planned procedure, indications, risks, alternatives and potential complications including but not limited to bleeding, infection, perforation, damage to internal organs and possible oversedation/side effects from anesthesia. The patient agrees and gives consent to proceed.  Please refer to procedure notes for findings, recommendations and patient disposition/instructions.     Anthony [name] Schick, MD [facility]

## 2023-10-04 NOTE — Op Note (Signed)
 [facility] Patient Name: Anthony [name] Procedure Date: 10/04/2023 9:56 AM MRN: [id number] Account #: [id number] Date of Birth: 20-Dec-1940 Admit Type: Outpatient Age: 83 Room: [facility] ENDO ROOM 1 Gender: Male Note Status: Finalized Instrument Name: Colon Scope [id number] Procedure:             Colonoscopy Indications:           Surveillance: Personal history of adenomatous polyps                         on last colonoscopy 5 years ago Providers:             Ole Schick MD, MD Referring MD:          Alda Carpen (Referring MD) Medicines:             Monitored Anesthesia Care Complications:         No immediate complications. Estimated blood loss:                         Minimal. Procedure:             Pre-Anesthesia Assessment:                        - Prior to the procedure, a History and Physical was                         performed, and patient medications and allergies were                         reviewed. The patient is competent. The risks and                         benefits of the procedure and the sedation options and                         risks were discussed with the patient. All questions                         were answered and informed consent was obtained.                         Patient identification and proposed procedure were                         verified by the physician, the nurse, the                         anesthesiologist, the anesthetist and the technician                         in the endoscopy suite. Mental Status Examination:                         alert and oriented. Airway Examination: normal                         oropharyngeal airway and neck mobility. Respiratory  Examination: clear to auscultation. CV Examination:                         normal. Prophylactic Antibiotics: The patient does not                         require prophylactic antibiotics. Prior                          Anticoagulants: The patient has taken Eliquis                         (apixaban), last dose was 3 days prior to procedure.                         ASA Grade Assessment: II - A patient with mild                         systemic disease. After reviewing the risks and                         benefits, the patient was deemed in satisfactory                         condition to undergo the procedure. The anesthesia                         plan was to use monitored anesthesia care (MAC).                         Immediately prior to administration of medications,                         the patient was re-assessed for adequacy to receive                         sedatives. The heart rate, respiratory rate, oxygen                         saturations, blood pressure, adequacy of pulmonary                         ventilation, and response to care were monitored                         throughout the procedure. The physical status of the                         patient was re-assessed after the procedure.                        After obtaining informed consent, the colonoscope was                         passed under direct vision. Throughout the procedure,                         the patient's blood pressure, pulse, and oxygen  saturations were monitored continuously. The                         Colonoscope was introduced through the anus and                         advanced to the the terminal ileum, with                         identification of the appendiceal orifice and IC                         valve. The colonoscopy was performed without                         difficulty. The patient tolerated the procedure well.                         The quality of the bowel preparation was good. The                         terminal ileum, ileocecal valve, appendiceal orifice,                         and rectum were photographed. Findings:      The perianal and digital rectal  examinations were normal.      The terminal ileum appeared normal.      A 1 mm polyp was found in the cecum. The polyp was sessile. The polyp       was removed with a jumbo cold forceps. Resection and retrieval were       complete. Estimated blood loss was minimal.      Two sessile polyps were found in the ascending colon. The polyps were 3       to 8 mm in size. These polyps were removed with a cold snare. Resection       and retrieval were complete. Estimated blood loss was minimal.      Multiple large-mouthed and small-mouthed diverticula were found in the       sigmoid colon and descending colon.      Internal hemorrhoids were found during retroflexion. The hemorrhoids       were Grade I (internal hemorrhoids that do not prolapse).      The exam was otherwise without abnormality on direct and retroflexion       views. Impression:            - The examined portion of the ileum was normal.                        - One 1 mm polyp in the cecum, removed with a jumbo                         cold forceps. Resected and retrieved.                        - Two 3 to 8 mm polyps in the ascending colon, removed                         with a cold snare.  Resected and retrieved.                        - Diverticulosis in the sigmoid colon and in the                         descending colon.                        - Internal hemorrhoids.                        - The examination was otherwise normal on direct and                         retroflexion views. Recommendation:        - Discharge patient to home.                        - Resume previous diet.                        - Continue present medications.                        - Await pathology results.                        - Repeat colonoscopy in 3 - 5 years for surveillance                         if benefits outweigh risks.                        - Return to referring physician as previously                         scheduled.                         - Resume Eliquis (apixaban) at prior dose today. Procedure Code(s):     --- Professional ---                        [phone], Colonoscopy, flexible; with removal of                         tumor(s), polyp(s), or other lesion(s) by snare                         technique                        45380, 59, Colonoscopy, flexible; with biopsy, single                         or multiple Diagnosis Code(s):     --- Professional ---                        Z86.010, Personal history of colonic polyps                        K64.0, First  degree hemorrhoids                        D12.0, Benign neoplasm of cecum                        D12.2, Benign neoplasm of ascending colon                        K57.30, Diverticulosis of large intestine without                         perforation or abscess without bleeding CPT copyright 2022 [employer]. All rights reserved. The codes documented in this report are preliminary and upon coder review may  be revised to meet current compliance requirements. Ole Schick MD, MD 10/04/2023 10:31:42 AM Number of Addenda: 0 Note Initiated On: 10/04/2023 9:56 AM Scope Withdrawal Time: 0 hours 10 minutes 14 seconds  Total Procedure Duration: 0 hours 14 minutes 17 seconds  Estimated Blood Loss:  Estimated blood loss was minimal.      [facility]

## 2023-10-05 LAB — SURGICAL PATHOLOGY

## 2023-10-25 ENCOUNTER — Ambulatory Visit: Payer: Medicare Other | Admitting: Dermatology

## 2024-07-04 ENCOUNTER — Ambulatory Visit: Admitting: Dermatology
